# Patient Record
Sex: Female | Born: 1965
Health system: Southern US, Community
[De-identification: ages and names within clinical notes are randomized; demographics above are authoritative.]

## PROBLEM LIST (undated history)

## (undated) DIAGNOSIS — E039 Hypothyroidism, unspecified: Secondary | ICD-10-CM

## (undated) DIAGNOSIS — T7840XA Allergy, unspecified, initial encounter: Secondary | ICD-10-CM

## (undated) DIAGNOSIS — N840 Polyp of corpus uteri: Secondary | ICD-10-CM

## (undated) DIAGNOSIS — F502 Bulimia nervosa, unspecified: Secondary | ICD-10-CM

## (undated) DIAGNOSIS — E876 Hypokalemia: Secondary | ICD-10-CM

## (undated) DIAGNOSIS — E785 Hyperlipidemia, unspecified: Secondary | ICD-10-CM

## (undated) HISTORY — PX: BREAST BIOPSY: SHX20

## (undated) HISTORY — DX: Bulimia nervosa: F50.2

## (undated) HISTORY — DX: Allergy, unspecified, initial encounter: T78.40XA

## (undated) HISTORY — DX: Hypokalemia: E87.6

## (undated) HISTORY — DX: Bulimia nervosa, unspecified: F50.20

## (undated) HISTORY — PX: LIPOMA EXCISION: SHX5283

## (undated) HISTORY — DX: Hyperlipidemia, unspecified: E78.5

## (undated) HISTORY — DX: Polyp of corpus uteri: N84.0

## (undated) HISTORY — DX: Hypothyroidism, unspecified: E03.9

---

## 1992-11-11 HISTORY — PX: BUNIONECTOMY: SHX129

## 1992-11-11 HISTORY — PX: MANDIBLE RECONSTRUCTION: SHX431

## 1998-05-02 ENCOUNTER — Ambulatory Visit (HOSPITAL_COMMUNITY): Admission: RE | Admit: 1998-05-02 | Discharge: 1998-05-02 | Payer: Self-pay | Admitting: Obstetrics and Gynecology

## 1998-06-13 ENCOUNTER — Ambulatory Visit (HOSPITAL_COMMUNITY): Admission: RE | Admit: 1998-06-13 | Discharge: 1998-06-13 | Payer: Self-pay | Admitting: Obstetrics & Gynecology

## 1999-01-31 ENCOUNTER — Inpatient Hospital Stay (HOSPITAL_COMMUNITY): Admission: AD | Admit: 1999-01-31 | Discharge: 1999-02-03 | Payer: Self-pay | Admitting: *Deleted

## 1999-03-07 ENCOUNTER — Other Ambulatory Visit: Admission: RE | Admit: 1999-03-07 | Discharge: 1999-03-07 | Payer: Self-pay | Admitting: Obstetrics and Gynecology

## 2000-03-19 ENCOUNTER — Other Ambulatory Visit: Admission: RE | Admit: 2000-03-19 | Discharge: 2000-03-19 | Payer: Self-pay | Admitting: Obstetrics and Gynecology

## 2001-02-26 ENCOUNTER — Inpatient Hospital Stay (HOSPITAL_COMMUNITY): Admission: AD | Admit: 2001-02-26 | Discharge: 2001-02-26 | Payer: Self-pay | Admitting: Obstetrics and Gynecology

## 2001-04-04 ENCOUNTER — Encounter (INDEPENDENT_AMBULATORY_CARE_PROVIDER_SITE_OTHER): Payer: Self-pay | Admitting: Specialist

## 2001-04-04 ENCOUNTER — Inpatient Hospital Stay (HOSPITAL_COMMUNITY): Admission: AD | Admit: 2001-04-04 | Discharge: 2001-04-06 | Payer: Self-pay | Admitting: Obstetrics and Gynecology

## 2001-05-05 ENCOUNTER — Other Ambulatory Visit: Admission: RE | Admit: 2001-05-05 | Discharge: 2001-05-05 | Payer: Self-pay | Admitting: Obstetrics and Gynecology

## 2001-08-25 ENCOUNTER — Encounter: Payer: Self-pay | Admitting: Family Medicine

## 2001-08-25 ENCOUNTER — Encounter: Admission: RE | Admit: 2001-08-25 | Discharge: 2001-08-25 | Payer: Self-pay | Admitting: Family Medicine

## 2001-09-02 ENCOUNTER — Encounter: Admission: RE | Admit: 2001-09-02 | Discharge: 2001-09-02 | Payer: Self-pay | Admitting: Family Medicine

## 2001-09-02 ENCOUNTER — Encounter: Payer: Self-pay | Admitting: Family Medicine

## 2001-09-09 ENCOUNTER — Encounter: Admission: RE | Admit: 2001-09-09 | Discharge: 2001-09-09 | Payer: Self-pay | Admitting: Family Medicine

## 2001-09-09 ENCOUNTER — Encounter: Payer: Self-pay | Admitting: Family Medicine

## 2002-05-26 ENCOUNTER — Other Ambulatory Visit: Admission: RE | Admit: 2002-05-26 | Discharge: 2002-05-26 | Payer: Self-pay | Admitting: Obstetrics and Gynecology

## 2004-07-29 ENCOUNTER — Ambulatory Visit (HOSPITAL_COMMUNITY): Admission: RE | Admit: 2004-07-29 | Discharge: 2004-07-29 | Payer: Self-pay | Admitting: Sports Medicine

## 2005-04-15 ENCOUNTER — Other Ambulatory Visit: Admission: RE | Admit: 2005-04-15 | Discharge: 2005-04-15 | Payer: Self-pay | Admitting: Family Medicine

## 2005-04-22 ENCOUNTER — Ambulatory Visit (HOSPITAL_COMMUNITY): Admission: RE | Admit: 2005-04-22 | Discharge: 2005-04-22 | Payer: Self-pay | Admitting: Family Medicine

## 2005-04-24 ENCOUNTER — Encounter: Admission: RE | Admit: 2005-04-24 | Discharge: 2005-04-24 | Payer: Self-pay | Admitting: Family Medicine

## 2006-01-19 ENCOUNTER — Emergency Department (HOSPITAL_COMMUNITY): Admission: EM | Admit: 2006-01-19 | Discharge: 2006-01-19 | Payer: Self-pay | Admitting: Family Medicine

## 2006-12-24 ENCOUNTER — Encounter: Admission: RE | Admit: 2006-12-24 | Discharge: 2006-12-24 | Payer: Self-pay | Admitting: Family Medicine

## 2007-11-12 HISTORY — PX: ENDOMETRIAL ABLATION: SHX621

## 2007-12-28 ENCOUNTER — Encounter: Admission: RE | Admit: 2007-12-28 | Discharge: 2007-12-28 | Payer: Self-pay | Admitting: Family Medicine

## 2008-01-02 ENCOUNTER — Emergency Department (HOSPITAL_COMMUNITY): Admission: EM | Admit: 2008-01-02 | Discharge: 2008-01-02 | Payer: Self-pay | Admitting: Family Medicine

## 2008-04-06 ENCOUNTER — Ambulatory Visit (HOSPITAL_COMMUNITY): Admission: RE | Admit: 2008-04-06 | Discharge: 2008-04-06 | Payer: Self-pay | Admitting: Family Medicine

## 2008-06-10 ENCOUNTER — Encounter (INDEPENDENT_AMBULATORY_CARE_PROVIDER_SITE_OTHER): Payer: Self-pay | Admitting: Obstetrics and Gynecology

## 2008-06-10 ENCOUNTER — Ambulatory Visit (HOSPITAL_COMMUNITY): Admission: RE | Admit: 2008-06-10 | Discharge: 2008-06-10 | Payer: Self-pay | Admitting: Obstetrics and Gynecology

## 2008-12-18 ENCOUNTER — Ambulatory Visit (HOSPITAL_COMMUNITY): Admission: RE | Admit: 2008-12-18 | Discharge: 2008-12-18 | Payer: Self-pay | Admitting: Family Medicine

## 2009-02-20 ENCOUNTER — Encounter: Admission: RE | Admit: 2009-02-20 | Discharge: 2009-02-20 | Payer: Self-pay | Admitting: Family Medicine

## 2010-03-28 ENCOUNTER — Encounter: Admission: RE | Admit: 2010-03-28 | Discharge: 2010-03-28 | Payer: Self-pay | Admitting: Family Medicine

## 2011-01-20 ENCOUNTER — Inpatient Hospital Stay (INDEPENDENT_AMBULATORY_CARE_PROVIDER_SITE_OTHER)
Admission: RE | Admit: 2011-01-20 | Discharge: 2011-01-20 | Disposition: A | Discharge status: Home / Self Care | Payer: Commercial Managed Care - PPO | Source: Ambulatory Visit | Attending: Emergency Medicine | Admitting: Emergency Medicine

## 2011-01-20 DIAGNOSIS — J069 Acute upper respiratory infection, unspecified: Secondary | ICD-10-CM

## 2011-01-20 DIAGNOSIS — R05 Cough: Secondary | ICD-10-CM

## 2011-01-20 DIAGNOSIS — R071 Chest pain on breathing: Secondary | ICD-10-CM

## 2011-03-01 ENCOUNTER — Other Ambulatory Visit: Payer: Self-pay | Admitting: Family Medicine

## 2011-03-01 DIAGNOSIS — Z1231 Encounter for screening mammogram for malignant neoplasm of breast: Secondary | ICD-10-CM

## 2011-03-26 NOTE — Op Note (Signed)
Tamara Brewer, Tamara Brewer                 ACCOUNT NO.:  [id number]   MEDICAL RECORD NO.:  [id number]          PATIENT TYPE:  AMB   LOCATION:  SDC                           FACILITY:  [facility]   PHYSICIAN:  Dineen Kid. Rana Snare, M.D.    DATE OF BIRTH:  1966/03/19   DATE OF PROCEDURE:  06/10/2008  DATE OF DISCHARGE:                               OPERATIVE REPORT   PREOPERATIVE DIAGNOSES:  1. Abnormal uterine bleeding.  2. Endometrial mass on saline infusion ultrasound.  3. Menorrhagia.   POSTOPERATIVE DIAGNOSES:  1. Abnormal uterine bleeding.  2. Endometrial mass on saline infusion ultrasound.  3. Menorrhagia.  4. Endometrial polyp.   SURGEON:  Dineen Kid. Rana Snare, MD   PROCEDURES:  Hysteroscopy, dilation and curettage with polypectomy, and  NovaSure endometrial ablation.   INDICATIONS:  Ms. Wolfley is a 45 year old with abnormal uterine bleeding  and saline infusion ultrasound showing endometrial mass.  She has no  further childbearing desires and also wished to proceed with NovaSure  endometrial ablation.  The risk, benefits, and procedures were discussed  at length which include, but not limited to risk of infection, bleeding,  damage to the uterus, tubes, ovary, bowel, bladder, possibility of  recurrence of polyps, and possibility this may not alleviate the  bleeding.  Discussed the success rates and complication rates of the  NovaSure ablation technique.  She gives her informed consent and wishes  to proceed.   FINDINGS:  At the time of surgery, there is a large endometrial polyp,  otherwise normal-appearing endometrial cavity.   DESCRIPTION OF PROCEDURE:  After adequate analgesia, the patient was  placed in the dorsal lithotomy position.  She is sterilely prepped and  draped.  The bladder was sterilely drained.  Graves speculum was placed.  Tenaculum was placed on the anterior lip of the cervix.  Paracervical  block was placed with 1% Xylocaine and 1:100,000 epinephrine, a total of  20 mL used.   Uterus was sounded to 8 cm, easily dilated to a #27 Pratt  dilator.  Hysteroscope was inserted.  The above findings were noted.  Polyp forceps were used to grasp and remove the endometrial polyp  followed by sharp curettage.  Retrieving fragments of the polyp and also  curettings was performed to a gritty surface that was felt throughout  the endometrial cavity.  Reexamination of the hysteroscope revealed a  normal-appearing cavity.  No residual polyps noted.  The NovaSure device  was then inserted.  Cavity assessment test easily passed with cavity  width of 4.4 cm and a cavity length of 4.5 cm.  The device was activated  for 1 minute 10 seconds, then it was removed.  Reexamination with the  hysteroscope revealed good thermal cautery effect throughout the  endometrium.  No obvious complications were noted.  The hysteroscope was  then removed.  Tenaculum removed from the cervix, noted be hemostatic.  The patient was then turned over to Dr. Darnell Level for his outpatient  procedure.  The saline deficit was 100 mL.  The estimated blood loss was  minimal, and the patient received 1 g  of cefotetan preoperatively and 30  mg of Toradol postoperatively.   DISPOSITION:  The patient will be discharged to home and follow up in  the office in 2-3 weeks.  Sent home with a routine instruction sheet for  D&C.  Told to return for increased pain, fever, or bleeding.  Also sent  home with a prescription for oxycodone #20.       Dineen Kid Rana Snare, M.D.  Electronically Signed     DCL/MEDQ  D:  06/10/2008  T:  06/10/2008  Job:  [id number]

## 2011-03-26 NOTE — Op Note (Signed)
[name], [name]                 ACCOUNT NO.:  [id number]   MEDICAL RECORD NO.:  [id number]          PATIENT TYPE:  AMB   LOCATION:  SDC                           FACILITY:  [facility]   PHYSICIAN:  Velora Heckler, MD      DATE OF BIRTH:  April 14, 1966   DATE OF PROCEDURE:  06/10/2008  DATE OF DISCHARGE:                               OPERATIVE REPORT   PREOPERATIVE DIAGNOSIS:  Soft tissue mass, right axilla.   POSTOPERATIVE DIAGNOSIS:  Soft tissue mass, right axilla.   PROCEDURE:  Excision of soft tissue mass, right axilla (4 x 4 x 2 cm).   SURGEON:  Velora Heckler, MD, FACS   ANESTHESIA:  Local with intravenous sedation.   PREPARATION:  Betadine.   COMPLICATIONS:  None.   BLOOD LOSS:  Minimal.   INDICATIONS:  The patient is a 45 year old white female from [place],  [place].  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  She has a soft tissue mass in the lateral portion of the  right axilla, which has been present for approximately 5 years and  gradually increasing in size.  She desires excision.  This was not  concurrent with a gynecologic procedure performed at [facility] by  Dr. Rana Snare.   BODY OF REPORT:  Procedure is done at OR #4 at the [facility],  [place].  The patient had previously been brought to the operating  room by Dr. Rana Snare and had undergone gynecologic procedure.  The patient  was then repositioned and prepped and draped in the usual strict aseptic  fashion.   After ascertaining an adequate level of sedation have been achieved,  skin is anesthetized in the high lateral right axilla with local  anesthetic.  A 3-cm incision is made with #15 blade.  Dissection is  carried down to the subcutaneous tissues and hemostasis was obtained  with the electrocautery.  Soft tissue mass is localized.  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  It is excised using the electrocautery for  hemostasis.  The entire specimen measures  approximately 4 x 4 x 2 cm and  is submitted to pathology for review.  Good hemostasis is obtained  throughout the wound.  Subcutaneous tissues are closed with interrupted  3-0 Vicryl sutures.  Skin is closed with a  running 4-0 Monocryl subcuticular suture.  Wound is washed and dried,  and Benzoin and Steri-Strips are applied.  Sterile dressings are  applied.  The patient is awakened from anesthesia and brought to the  recovery room in stable condition.  The patient tolerated the procedure  well.      Velora Heckler, MD  Electronically Signed     TMG/MEDQ  D:  06/10/2008  T:  06/11/2008  Job:  [id number]   cc:   Talmadge Coventry, M.D.  Fax: [phone]   Dineen Kid. Rana Snare, M.D.  Fax: [phone]

## 2011-04-09 ENCOUNTER — Encounter: Payer: Self-pay | Admitting: Family Medicine

## 2011-04-09 ENCOUNTER — Ambulatory Visit (INDEPENDENT_AMBULATORY_CARE_PROVIDER_SITE_OTHER): Payer: 59 | Admitting: Family Medicine

## 2011-04-09 VITALS — BP 88/64 | HR 54 | Temp 97.9°F | Resp 14 | Ht 64.5 in | Wt 124.5 lb

## 2011-04-09 DIAGNOSIS — F502 Bulimia nervosa: Secondary | ICD-10-CM

## 2011-04-09 DIAGNOSIS — E039 Hypothyroidism, unspecified: Secondary | ICD-10-CM

## 2011-04-09 DIAGNOSIS — Z0000 Encounter for general adult medical examination without abnormal findings: Secondary | ICD-10-CM

## 2011-04-09 LAB — POCT URINALYSIS DIPSTICK
Bilirubin, UA: NEGATIVE
Blood, UA: NEGATIVE
Leukocytes, UA: NEGATIVE
Nitrite, UA: NEGATIVE
Protein, UA: NEGATIVE
Urobilinogen, UA: 0.2

## 2011-04-09 LAB — BASIC METABOLIC PANEL
BUN: 11 mg/dL (ref 6–23)
Calcium: 10 mg/dL (ref 8.4–10.5)
GFR: 63.95 mL/min (ref >60.00)
Potassium: 3.9 mEq/L (ref 3.5–5.1)
Sodium: 139 mEq/L (ref 135–145)

## 2011-04-09 LAB — CBC WITH DIFFERENTIAL/PLATELET
Basophils Relative: 0.6 % (ref 0.0–3.0)
Eosinophils Absolute: 0.1 10*3/uL (ref 0.0–0.7)
HCT: 41.2 % (ref 36.0–46.0)
Hemoglobin: 14.4 g/dL (ref 12.0–15.0)
Lymphocytes Relative: 24.9 % (ref 12.0–46.0)
Lymphs Abs: 1.6 10*3/uL (ref 0.7–4.0)
Monocytes Relative: 7.3 % (ref 3.0–12.0)
Neutro Abs: 4.1 10*3/uL (ref 1.4–7.7)
Neutrophils Relative %: 66 % (ref 43.0–77.0)
Platelets: 251 10*3/uL (ref 150.0–400.0)
WBC: 6.2 10*3/uL (ref 4.5–10.5)

## 2011-04-09 LAB — LIPID PANEL
LDL Cholesterol: 101 mg/dL — ABNORMAL HIGH (ref 0–99)
Total CHOL/HDL Ratio: 3
Triglycerides: 88 mg/dL (ref 0.0–149.0)

## 2011-04-09 LAB — HEPATIC FUNCTION PANEL
ALT: 20 U/L (ref 0–35)
Bilirubin, Direct: 0.1 mg/dL (ref 0.0–0.3)
Total Bilirubin: 0.9 mg/dL (ref 0.3–1.2)

## 2011-04-09 NOTE — Progress Notes (Signed)
  Subjective:    Patient ID: Tamara Brewer, female    DOB: 03/04/1966, 45 y.o.   MRN: [id number]  HPI 45 yr old female to establish with [place] after transfering from Dr. Noel Journey. She is also here for a cpx. Currently she is doing well with no complaints. She has ongoing issues with bulimia, but she says this is currently well controlled. She makes good dietary choices and exercises regularly. Her weight is stable, and she had complete labs about 2 months ago that were normal. She asks if she can stop her potassium pill since her level is now normal. This came up last year when she was purging a lot and causing herself to vomit. She no longer purges at all. Last year was difficult for her because of her husband's problems. She describes him as an "alcoholic", and after he lost his job last year he struggled with depression. He attempted suicide twice, and obviously this put her under a great deal of stress. Fortunately he is now working again, and his depression is under control. Consequently her stress levels are much better also. She sees Dr. Sandria Manly for GYN exams.    Review of Systems  Constitutional: Negative.   HENT: Negative.   Eyes: Negative.   Respiratory: Negative.   Cardiovascular: Negative.   Gastrointestinal: Negative.   Genitourinary: Negative for dysuria, urgency, frequency, hematuria, flank pain, decreased urine volume, enuresis, difficulty urinating, pelvic pain and dyspareunia.  Musculoskeletal: Negative.   Skin: Negative.   Neurological: Negative.   Hematological: Negative.   Psychiatric/Behavioral: Negative.        Objective:   Physical Exam  Constitutional: She is oriented to person, place, and time. She appears well-developed and well-nourished. No distress.  HENT:  Head: Normocephalic and atraumatic.  Right Ear: External ear normal.  Left Ear: External ear normal.  Nose: Nose normal.  Mouth/Throat: Oropharynx is clear and moist. No oropharyngeal exudate.  Eyes:  Conjunctivae and EOM are normal. Pupils are equal, round, and reactive to light. No scleral icterus.  Neck: Normal range of motion. Neck supple. No JVD present. No thyromegaly present.  Cardiovascular: Normal rate, regular rhythm, normal heart sounds and intact distal pulses.  Exam reveals no gallop and no friction rub.   No murmur heard. Pulmonary/Chest: Effort normal and breath sounds normal. No respiratory distress. She has no wheezes. She has no rales. She exhibits no tenderness.  Abdominal: Soft. Bowel sounds are normal. She exhibits no distension and no mass. There is no tenderness. There is no rebound and no guarding.  Musculoskeletal: Normal range of motion. She exhibits no edema and no tenderness.  Lymphadenopathy:    She has no cervical adenopathy.  Neurological: She is alert and oriented to person, place, and time. She has normal reflexes. No cranial nerve deficit. She exhibits normal muscle tone. Coordination normal.  Skin: Skin is warm and dry. No rash noted. No erythema.  Psychiatric: She has a normal mood and affect. Her behavior is normal. Judgment and thought content normal.          Assessment & Plan:  She seems to be doing very well, and her eating disorder seems to be under control. We will get fasting labs today, and if her potassium looks okay I will stop her supplement.

## 2011-04-10 ENCOUNTER — Encounter: Payer: Self-pay | Admitting: Family Medicine

## 2011-04-10 DIAGNOSIS — F502 Bulimia nervosa, unspecified: Secondary | ICD-10-CM | POA: Insufficient documentation

## 2011-04-10 DIAGNOSIS — E039 Hypothyroidism, unspecified: Secondary | ICD-10-CM | POA: Insufficient documentation

## 2011-04-12 ENCOUNTER — Ambulatory Visit
Admission: RE | Admit: 2011-04-12 | Discharge: 2011-04-12 | Disposition: A | Discharge status: Home / Self Care | Payer: 59 | Source: Ambulatory Visit | Attending: Family Medicine | Admitting: Family Medicine

## 2011-04-12 DIAGNOSIS — Z1231 Encounter for screening mammogram for malignant neoplasm of breast: Secondary | ICD-10-CM

## 2011-04-16 ENCOUNTER — Other Ambulatory Visit: Payer: Self-pay | Admitting: Family Medicine

## 2011-04-16 DIAGNOSIS — R928 Other abnormal and inconclusive findings on diagnostic imaging of breast: Secondary | ICD-10-CM

## 2011-04-26 ENCOUNTER — Ambulatory Visit
Admission: RE | Admit: 2011-04-26 | Discharge: 2011-04-26 | Disposition: A | Discharge status: Home / Self Care | Payer: 59 | Source: Ambulatory Visit | Attending: Family Medicine | Admitting: Family Medicine

## 2011-04-26 DIAGNOSIS — R928 Other abnormal and inconclusive findings on diagnostic imaging of breast: Secondary | ICD-10-CM

## 2011-05-03 ENCOUNTER — Other Ambulatory Visit: Payer: Self-pay | Admitting: Family Medicine

## 2011-05-03 MED ORDER — LEVOTHYROXINE SODIUM 75 MCG PO TABS: 75.0000 ug | ORAL_TABLET | Freq: Every day | ORAL | Status: DC

## 2011-05-03 NOTE — Telephone Encounter (Signed)
Pt needs new script written for levothyroxine (SYNTHROID, LEVOTHROID) 75 MCG tablet , sent to [facility] in [facility] at med ctr

## 2011-08-02 LAB — HERPES SIMPLEX VIRUS CULTURE: Culture: NOT DETECTED

## 2011-08-09 LAB — CBC
HCT: 41.5
MCV: 91.9
RDW: 12.5

## 2011-11-06 ENCOUNTER — Telehealth: Payer: Self-pay | Admitting: Family Medicine

## 2011-11-06 MED ORDER — LEVOTHYROXINE SODIUM 75 MCG PO TABS: 75.0000 ug | ORAL_TABLET | Freq: Every day | ORAL | Status: DC

## 2011-11-06 NOTE — Telephone Encounter (Signed)
Script sent e-scribe 

## 2012-01-06 ENCOUNTER — Encounter: Payer: Self-pay | Admitting: Family Medicine

## 2012-01-06 ENCOUNTER — Ambulatory Visit (INDEPENDENT_AMBULATORY_CARE_PROVIDER_SITE_OTHER): Payer: 59 | Admitting: Family Medicine

## 2012-01-06 VITALS — BP 108/60 | HR 75 | Temp 98.7°F | Wt 122.0 lb

## 2012-01-06 DIAGNOSIS — J329 Chronic sinusitis, unspecified: Secondary | ICD-10-CM

## 2012-01-06 MED ORDER — AZITHROMYCIN 250 MG PO TABS: ORAL_TABLET | ORAL | Status: AC

## 2012-01-06 NOTE — Progress Notes (Signed)
  Subjective:    Patient ID: Tamara Brewer, female    DOB: 12-29-1965, 46 y.o.   MRN: [id number]  HPI Here for one week of stuffy head, PND, ST, and a dry cough. No fever. On Mucinex.    Review of Systems  Constitutional: Negative.   HENT: Positive for congestion, postnasal drip and sinus pressure.   Eyes: Negative.   Respiratory: Positive for cough.        Objective:   Physical Exam  Constitutional: She appears well-developed and well-nourished.  HENT:  Right Ear: External ear normal.  Left Ear: External ear normal.  Nose: Nose normal.  Mouth/Throat: Oropharynx is clear and moist. No oropharyngeal exudate.  Eyes: Conjunctivae are normal.  Neck: No thyromegaly present.  Pulmonary/Chest: Effort normal and breath sounds normal.  Lymphadenopathy:    She has no cervical adenopathy.          Assessment & Plan:  Recheck prn

## 2012-03-27 ENCOUNTER — Other Ambulatory Visit (INDEPENDENT_AMBULATORY_CARE_PROVIDER_SITE_OTHER): Payer: 59

## 2012-03-27 DIAGNOSIS — Z0000 Encounter for general adult medical examination without abnormal findings: Secondary | ICD-10-CM

## 2012-03-27 LAB — POCT URINALYSIS DIPSTICK
Nitrite, UA: NEGATIVE
Urobilinogen, UA: 0.2
pH, UA: 8.5

## 2012-03-27 LAB — HEPATIC FUNCTION PANEL
Bilirubin, Direct: 0.1 mg/dL (ref 0.0–0.3)
Total Bilirubin: 1 mg/dL (ref 0.3–1.2)

## 2012-03-27 LAB — CBC WITH DIFFERENTIAL/PLATELET
Basophils Absolute: 0.1 10*3/uL (ref 0.0–0.1)
Basophils Relative: 1.2 % (ref 0.0–3.0)
Eosinophils Absolute: 0.1 10*3/uL (ref 0.0–0.7)
Lymphocytes Relative: 29.4 % (ref 12.0–46.0)
Monocytes Absolute: 0.5 10*3/uL (ref 0.1–1.0)
Neutrophils Relative %: 59 % (ref 43.0–77.0)
Platelets: 252 10*3/uL (ref 150.0–400.0)
WBC: 5.9 10*3/uL (ref 4.5–10.5)

## 2012-03-27 LAB — BASIC METABOLIC PANEL
BUN: 16 mg/dL (ref 6–23)
CO2: 29 mEq/L (ref 19–32)
Calcium: 9.6 mg/dL (ref 8.4–10.5)
Creatinine, Ser: 0.9 mg/dL (ref 0.4–1.2)
GFR: 68.39 mL/min (ref >60.00)
Glucose, Bld: 81 mg/dL (ref 70–99)
Potassium: 2.7 mEq/L — CL (ref 3.5–5.1)

## 2012-03-27 LAB — TSH: TSH: 4.08 u[IU]/mL (ref 0.35–5.50)

## 2012-03-27 LAB — LIPID PANEL: Triglycerides: 89 mg/dL (ref 0.0–149.0)

## 2012-03-31 ENCOUNTER — Encounter: Payer: Self-pay | Admitting: Family Medicine

## 2012-03-31 MED ORDER — POTASSIUM CHLORIDE 20 MEQ PO PACK: 20.0000 meq | PACK | Freq: Every day | ORAL | Status: DC

## 2012-03-31 NOTE — Progress Notes (Signed)
Quick Note:  I left voice message for pt to return my call. ______ 

## 2012-03-31 NOTE — Progress Notes (Signed)
Addended by: Aniceto Boss A on: 03/31/2012 03:16 PM   Modules accepted: Orders

## 2012-03-31 NOTE — Progress Notes (Signed)
Quick Note:  I spoke with pt and sent script e-scribe. ______ 

## 2012-04-13 ENCOUNTER — Other Ambulatory Visit: Payer: Self-pay | Admitting: Family Medicine

## 2012-04-13 DIAGNOSIS — Z1231 Encounter for screening mammogram for malignant neoplasm of breast: Secondary | ICD-10-CM

## 2012-04-24 ENCOUNTER — Encounter: Payer: Self-pay | Admitting: Family Medicine

## 2012-04-24 ENCOUNTER — Other Ambulatory Visit (HOSPITAL_COMMUNITY)
Admission: RE | Admit: 2012-04-24 | Discharge: 2012-04-24 | Disposition: A | Discharge status: Home / Self Care | Payer: 59 | Source: Ambulatory Visit | Attending: Family Medicine | Admitting: Family Medicine

## 2012-04-24 ENCOUNTER — Ambulatory Visit (INDEPENDENT_AMBULATORY_CARE_PROVIDER_SITE_OTHER): Payer: 59 | Admitting: Family Medicine

## 2012-04-24 VITALS — BP 110/72 | HR 71 | Temp 98.5°F | Ht 63.5 in | Wt 121.0 lb

## 2012-04-24 DIAGNOSIS — Z0000 Encounter for general adult medical examination without abnormal findings: Secondary | ICD-10-CM

## 2012-04-24 DIAGNOSIS — Z01419 Encounter for gynecological examination (general) (routine) without abnormal findings: Secondary | ICD-10-CM | POA: Insufficient documentation

## 2012-04-24 MED ORDER — LEVOTHYROXINE SODIUM 75 MCG PO TABS: 75.0000 ug | ORAL_TABLET | Freq: Every day | ORAL | Status: DC

## 2012-04-24 NOTE — Progress Notes (Signed)
Subjective:    Patient ID: Tamara Brewer, female    DOB: 09-28-1966, 46 y.o.   MRN: [id number]  HPI 46 yr old female for a cpx. She feels fine and has no concerns. 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 She has a mammogram set up for next week.   Review of Systems  Constitutional: Negative.  Negative for fever, diaphoresis, activity change, appetite change, fatigue and unexpected weight change.  HENT: Negative.  Negative for hearing loss, ear pain, nosebleeds, congestion, sore throat, trouble swallowing, neck pain, neck stiffness, voice change and tinnitus.   Eyes: Negative.  Negative for photophobia, pain, discharge, redness and visual disturbance.  Respiratory: Negative.  Negative for apnea, cough, choking, chest tightness, shortness of breath, wheezing and stridor.   Cardiovascular: Negative.  Negative for chest pain, palpitations and leg swelling.  Gastrointestinal: Negative.  Negative for nausea, vomiting, abdominal pain, diarrhea, constipation, blood in stool, abdominal distention and rectal pain.  Genitourinary: Negative.  Negative for dysuria, urgency, frequency, hematuria, flank pain, vaginal bleeding, vaginal discharge, enuresis, difficulty urinating, vaginal pain and menstrual problem.  Musculoskeletal: Negative.  Negative for myalgias, back pain, joint swelling, arthralgias and gait problem.  Skin: Negative.  Negative for color change, pallor, rash and wound.  Neurological: Negative.  Negative for dizziness, tremors, seizures, syncope, speech difficulty, weakness, light-headedness, numbness and headaches.  Hematological: Negative.  Negative for adenopathy. Does not bruise/bleed easily.  Psychiatric/Behavioral: Negative.  Negative for hallucinations, behavioral problems, confusion, disturbed wake/sleep cycle, dysphoric mood and agitation. The patient is not nervous/anxious.        Objective:   Physical Exam  Constitutional: She appears well-developed and well-nourished. No  distress.  HENT:  Head: Normocephalic and atraumatic.  Right Ear: External ear normal.  Left Ear: External ear normal.  Nose: Nose normal.  Mouth/Throat: Oropharynx is clear and moist. No oropharyngeal exudate.  Eyes: Conjunctivae and EOM are normal. Pupils are equal, round, and reactive to light. Right eye exhibits no discharge. Left eye exhibits no discharge. No scleral icterus.  Neck: Normal range of motion. Neck supple. No JVD present. No thyromegaly present.  Cardiovascular: Normal rate, regular rhythm, normal heart sounds and intact distal pulses.  Exam reveals no gallop and no friction rub.   No murmur heard. Pulmonary/Chest: Effort normal and breath sounds normal. No stridor. No respiratory distress. She has no wheezes. She has no rales. She exhibits no tenderness.  Abdominal: Soft. Normal appearance and bowel sounds are normal. She exhibits no distension, no abdominal bruit, no ascites and no mass. There is no hepatosplenomegaly. There is no tenderness. There is no rigidity, no rebound and no guarding. No hernia.  Genitourinary: Rectum normal, vagina normal and uterus normal. No breast swelling, tenderness, discharge or bleeding. Cervix exhibits no motion tenderness, no discharge and no friability. Right adnexum displays no mass, no tenderness and no fullness. Left adnexum displays no mass, no tenderness and no fullness. No erythema, tenderness or bleeding around the vagina. No vaginal discharge found.  Musculoskeletal: Normal range of motion. She exhibits no edema and no tenderness.  Lymphadenopathy:    She has no cervical adenopathy.  Neurological: She is alert. She has normal reflexes. No cranial nerve deficit. She exhibits normal muscle tone. Coordination normal.  Skin: Skin is warm and dry. No rash noted. She is not diaphoretic. No erythema. No pallor.  Psychiatric: She has a normal mood and affect. Her behavior is normal. Judgment and thought content normal.  Assessment & Plan:  Well exam.

## 2012-04-24 NOTE — Addendum Note (Signed)
Addended by: Aniceto Boss A on: 04/24/2012 10:01 AM   Modules accepted: Orders

## 2012-04-28 ENCOUNTER — Ambulatory Visit
Admission: RE | Admit: 2012-04-28 | Discharge: 2012-04-28 | Disposition: A | Discharge status: Home / Self Care | Payer: 59 | Source: Ambulatory Visit | Attending: Family Medicine | Admitting: Family Medicine

## 2012-04-28 DIAGNOSIS — Z1231 Encounter for screening mammogram for malignant neoplasm of breast: Secondary | ICD-10-CM

## 2012-05-01 ENCOUNTER — Other Ambulatory Visit: Payer: Self-pay | Admitting: *Deleted

## 2012-05-01 ENCOUNTER — Other Ambulatory Visit (INDEPENDENT_AMBULATORY_CARE_PROVIDER_SITE_OTHER): Payer: 59

## 2012-05-01 DIAGNOSIS — E876 Hypokalemia: Secondary | ICD-10-CM

## 2012-05-01 LAB — BASIC METABOLIC PANEL
BUN: 13 mg/dL (ref 6–23)
CO2: 29 mEq/L (ref 19–32)
Calcium: 9 mg/dL (ref 8.4–10.5)
Creatinine, Ser: 0.9 mg/dL (ref 0.4–1.2)
GFR: 76.78 mL/min (ref >60.00)
Glucose, Bld: 139 mg/dL — ABNORMAL HIGH (ref 70–99)
Sodium: 138 mEq/L (ref 135–145)

## 2012-05-04 MED ORDER — POTASSIUM CHLORIDE CRYS ER 20 MEQ PO TBCR: 20.0000 meq | EXTENDED_RELEASE_TABLET | Freq: Two times a day (BID) | ORAL | Status: DC

## 2012-05-04 NOTE — Progress Notes (Signed)
Quick Note:  I spoke with pt and I sent in script e-scribe. ______

## 2013-03-12 ENCOUNTER — Ambulatory Visit: Payer: 59 | Admitting: Family Medicine

## 2013-03-23 ENCOUNTER — Other Ambulatory Visit: Payer: Self-pay

## 2013-03-23 DIAGNOSIS — Z1231 Encounter for screening mammogram for malignant neoplasm of breast: Secondary | ICD-10-CM

## 2013-04-19 ENCOUNTER — Other Ambulatory Visit (INDEPENDENT_AMBULATORY_CARE_PROVIDER_SITE_OTHER): Payer: 59

## 2013-04-19 DIAGNOSIS — Z0000 Encounter for general adult medical examination without abnormal findings: Secondary | ICD-10-CM

## 2013-04-19 LAB — POCT URINALYSIS DIPSTICK
Glucose, UA: NEGATIVE
Leukocytes, UA: NEGATIVE
Nitrite, UA: NEGATIVE
pH, UA: >=9

## 2013-04-19 LAB — CBC WITH DIFFERENTIAL/PLATELET
Basophils Absolute: 0 10*3/uL (ref 0.0–0.1)
Eosinophils Relative: 2.7 % (ref 0.0–5.0)
HCT: 41.3 % (ref 36.0–46.0)
Hemoglobin: 14.2 g/dL (ref 12.0–15.0)
Monocytes Absolute: 0.5 10*3/uL (ref 0.1–1.0)
Monocytes Relative: 9 % (ref 3.0–12.0)
Neutrophils Relative %: 56.6 % (ref 43.0–77.0)
Platelets: 254 10*3/uL (ref 150.0–400.0)
RBC: 4.49 Mil/uL (ref 3.87–5.11)
RDW: 13.1 % (ref 11.5–14.6)

## 2013-04-19 LAB — BASIC METABOLIC PANEL
CO2: 35 mEq/L — ABNORMAL HIGH (ref 19–32)
Chloride: 98 mEq/L (ref 96–112)
Glucose, Bld: 88 mg/dL (ref 70–99)
Potassium: 3.6 mEq/L (ref 3.5–5.1)
Sodium: 139 mEq/L (ref 135–145)

## 2013-04-19 LAB — LIPID PANEL
HDL: 68 mg/dL (ref >39.00)
Triglycerides: 115 mg/dL (ref 0.0–149.0)
VLDL: 23 mg/dL (ref 0.0–40.0)

## 2013-04-19 LAB — HEPATIC FUNCTION PANEL
ALT: 23 U/L (ref 0–35)
AST: 29 U/L (ref 0–37)
Bilirubin, Direct: 0.1 mg/dL (ref 0.0–0.3)
Total Bilirubin: 1 mg/dL (ref 0.3–1.2)

## 2013-04-20 NOTE — Progress Notes (Signed)
Quick Note:  Pt has appointment on 04/28/13 will go over then. ______ 

## 2013-04-28 ENCOUNTER — Other Ambulatory Visit: Payer: 59

## 2013-04-28 ENCOUNTER — Ambulatory Visit (INDEPENDENT_AMBULATORY_CARE_PROVIDER_SITE_OTHER): Payer: 59 | Admitting: Family Medicine

## 2013-04-28 ENCOUNTER — Encounter: Payer: Self-pay | Admitting: Family Medicine

## 2013-04-28 VITALS — BP 100/64 | HR 59 | Temp 97.8°F | Ht 64.0 in | Wt 126.0 lb

## 2013-04-28 DIAGNOSIS — Z0000 Encounter for general adult medical examination without abnormal findings: Secondary | ICD-10-CM

## 2013-04-28 MED ORDER — POTASSIUM CHLORIDE CRYS ER 20 MEQ PO TBCR: 20.0000 meq | EXTENDED_RELEASE_TABLET | Freq: Every day | ORAL | Status: DC

## 2013-04-28 MED ORDER — LEVOTHYROXINE SODIUM 75 MCG PO TABS: 75.0000 ug | ORAL_TABLET | Freq: Every day | ORAL | Status: DC

## 2013-04-28 NOTE — Progress Notes (Signed)
  Subjective:    Patient ID: Tamara Brewer, female    DOB: Sep 17, 1966, 47 y.o.   MRN: [id number]  HPI 47 yr old female for a cpx. She feels well and has no concerns. She eats well and exercises regularly.    Review of Systems  Constitutional: Negative.   HENT: Negative.   Eyes: Negative.   Respiratory: Negative.   Cardiovascular: Negative.   Gastrointestinal: Negative.   Genitourinary: Negative for dysuria, urgency, frequency, hematuria, flank pain, decreased urine volume, enuresis, difficulty urinating, pelvic pain and dyspareunia.  Musculoskeletal: Negative.   Skin: Negative.   Neurological: Negative.   Psychiatric/Behavioral: Negative.        Objective:   Physical Exam  Constitutional: She is oriented to person, place, and time. She appears well-developed and well-nourished. No distress.  HENT:  Head: Normocephalic and atraumatic.  Right Ear: External ear normal.  Left Ear: External ear normal.  Nose: Nose normal.  Mouth/Throat: Oropharynx is clear and moist. No oropharyngeal exudate.  Eyes: Conjunctivae and EOM are normal. Pupils are equal, round, and reactive to light. No scleral icterus.  Neck: Normal range of motion. Neck supple. No JVD present. No thyromegaly present.  Cardiovascular: Normal rate, regular rhythm, normal heart sounds and intact distal pulses.  Exam reveals no gallop and no friction rub.   No murmur heard. Pulmonary/Chest: Effort normal and breath sounds normal. No respiratory distress. She has no wheezes. She has no rales. She exhibits no tenderness.  Abdominal: Soft. Bowel sounds are normal. She exhibits no distension and no mass. There is no tenderness. There is no rebound and no guarding.  Musculoskeletal: Normal range of motion. She exhibits no edema and no tenderness.  Lymphadenopathy:    She has no cervical adenopathy.  Neurological: She is alert and oriented to person, place, and time. She has normal reflexes. No cranial nerve deficit. She  exhibits normal muscle tone. Coordination normal.  Skin: Skin is warm and dry. No rash noted. No erythema.  Psychiatric: She has a normal mood and affect. Her behavior is normal. Judgment and thought content normal.          Assessment & Plan:  Well exam.

## 2013-05-04 ENCOUNTER — Ambulatory Visit
Admission: RE | Admit: 2013-05-04 | Discharge: 2013-05-04 | Disposition: A | Discharge status: Home / Self Care | Payer: 59 | Source: Ambulatory Visit

## 2013-05-04 DIAGNOSIS — Z1231 Encounter for screening mammogram for malignant neoplasm of breast: Secondary | ICD-10-CM

## 2013-05-05 ENCOUNTER — Encounter: Payer: 59 | Admitting: Family Medicine

## 2013-09-16 ENCOUNTER — Other Ambulatory Visit: Payer: Self-pay

## 2014-03-31 ENCOUNTER — Other Ambulatory Visit: Payer: Self-pay

## 2014-03-31 DIAGNOSIS — Z1231 Encounter for screening mammogram for malignant neoplasm of breast: Secondary | ICD-10-CM

## 2014-04-18 ENCOUNTER — Other Ambulatory Visit (INDEPENDENT_AMBULATORY_CARE_PROVIDER_SITE_OTHER): Payer: 59

## 2014-04-18 DIAGNOSIS — Z0000 Encounter for general adult medical examination without abnormal findings: Secondary | ICD-10-CM

## 2014-04-18 LAB — POCT URINALYSIS DIPSTICK
Bilirubin, UA: NEGATIVE
Blood, UA: NEGATIVE
Glucose, UA: NEGATIVE
KETONES UA: NEGATIVE
Nitrite, UA: NEGATIVE
Protein, UA: NEGATIVE
Spec Grav, UA: 1.015
UROBILINOGEN UA: 0.2
pH, UA: 8

## 2014-04-18 LAB — CBC WITH DIFFERENTIAL/PLATELET
Basophils Absolute: 0 10*3/uL (ref 0.0–0.1)
Basophils Relative: 0.8 % (ref 0.0–3.0)
EOS ABS: 0.2 10*3/uL (ref 0.0–0.7)
Eosinophils Relative: 3.2 % (ref 0.0–5.0)
HCT: 41.1 % (ref 36.0–46.0)
Hemoglobin: 14 g/dL (ref 12.0–15.0)
LYMPHS PCT: 35.8 % (ref 12.0–46.0)
Lymphs Abs: 1.8 10*3/uL (ref 0.7–4.0)
MCHC: 34.1 g/dL (ref 30.0–36.0)
MCV: 92 fl (ref 78.0–100.0)
Monocytes Absolute: 0.4 10*3/uL (ref 0.1–1.0)
Monocytes Relative: 8.5 % (ref 3.0–12.0)
NEUTROS PCT: 51.7 % (ref 43.0–77.0)
Neutro Abs: 2.6 10*3/uL (ref 1.4–7.7)
Platelets: 259 10*3/uL (ref 150.0–400.0)
RBC: 4.47 Mil/uL (ref 3.87–5.11)
RDW: 13 % (ref 11.5–15.5)
WBC: 5 10*3/uL (ref 4.0–10.5)

## 2014-04-18 LAB — BASIC METABOLIC PANEL
BUN: 16 mg/dL (ref 6–23)
CALCIUM: 10.2 mg/dL (ref 8.4–10.5)
CO2: 32 meq/L (ref 19–32)
CREATININE: 0.8 mg/dL (ref 0.4–1.2)
Chloride: 98 mEq/L (ref 96–112)
GFR: 78.24 mL/min (ref >60.00)
GLUCOSE: 86 mg/dL (ref 70–99)
Potassium: 3.6 mEq/L (ref 3.5–5.1)
SODIUM: 139 meq/L (ref 135–145)

## 2014-04-18 LAB — LIPID PANEL
CHOLESTEROL: 226 mg/dL — AB (ref 0–200)
HDL: 73.1 mg/dL (ref >39.00)
LDL CALC: 125 mg/dL — AB (ref 0–99)
NonHDL: 152.9
TRIGLYCERIDES: 140 mg/dL (ref 0.0–149.0)
Total CHOL/HDL Ratio: 3
VLDL: 28 mg/dL (ref 0.0–40.0)

## 2014-04-18 LAB — TSH: TSH: 5.25 u[IU]/mL — ABNORMAL HIGH (ref 0.35–4.50)

## 2014-04-18 LAB — HEPATIC FUNCTION PANEL
ALBUMIN: 4.5 g/dL (ref 3.5–5.2)
ALK PHOS: 52 U/L (ref 39–117)
ALT: 22 U/L (ref 0–35)
AST: 26 U/L (ref 0–37)
BILIRUBIN DIRECT: 0.1 mg/dL (ref 0.0–0.3)
BILIRUBIN TOTAL: 0.9 mg/dL (ref 0.2–1.2)
Total Protein: 7.2 g/dL (ref 6.0–8.3)

## 2014-04-21 MED ORDER — LEVOTHYROXINE SODIUM 100 MCG PO TABS: 100.0000 ug | ORAL_TABLET | Freq: Every day | ORAL | Status: DC

## 2014-04-21 NOTE — Addendum Note (Signed)
Addended by: Aggie Hacker A on: 04/21/2014 11:54 AM   Modules accepted: Orders, Medications

## 2014-04-25 ENCOUNTER — Encounter: Payer: 59 | Admitting: Family Medicine

## 2014-05-10 ENCOUNTER — Ambulatory Visit
Admission: RE | Admit: 2014-05-10 | Discharge: 2014-05-10 | Disposition: A | Discharge status: Home / Self Care | Payer: 59 | Source: Ambulatory Visit

## 2014-05-10 ENCOUNTER — Encounter: Payer: 59 | Admitting: Family Medicine

## 2014-05-10 DIAGNOSIS — Z1231 Encounter for screening mammogram for malignant neoplasm of breast: Secondary | ICD-10-CM

## 2014-06-06 ENCOUNTER — Other Ambulatory Visit: Payer: Self-pay | Admitting: Family Medicine

## 2014-06-13 ENCOUNTER — Ambulatory Visit (INDEPENDENT_AMBULATORY_CARE_PROVIDER_SITE_OTHER): Payer: 59 | Admitting: Family Medicine

## 2014-06-13 ENCOUNTER — Encounter: Payer: Self-pay | Admitting: Family Medicine

## 2014-06-13 VITALS — BP 101/62 | HR 57 | Temp 97.7°F | Ht 64.0 in | Wt 124.0 lb

## 2014-06-13 DIAGNOSIS — E876 Hypokalemia: Secondary | ICD-10-CM

## 2014-06-13 DIAGNOSIS — Z0000 Encounter for general adult medical examination without abnormal findings: Secondary | ICD-10-CM

## 2014-06-13 MED ORDER — POTASSIUM CHLORIDE CRYS ER 20 MEQ PO TBCR: EXTENDED_RELEASE_TABLET | ORAL | Status: DC

## 2014-06-13 NOTE — Progress Notes (Signed)
Pre visit review using our clinic review tool, if applicable. No additional management support is needed unless otherwise documented below in the visit note. 

## 2014-06-13 NOTE — Progress Notes (Signed)
   Subjective:    Patient ID: Tamara Brewer, female    DOB: 02/24/1966, 48 y.o.   MRN: [id number]  HPI 47 yr old female for a cpx. She feels well except for some mild fatigue lately. We discovered her Synthroid dose needed to be increased, so hopefully this will help.    Review of Systems  Constitutional: Negative.   HENT: Negative.   Eyes: Negative.   Respiratory: Negative.   Cardiovascular: Negative.   Gastrointestinal: Negative.   Genitourinary: Negative for dysuria, urgency, frequency, hematuria, flank pain, decreased urine volume, enuresis, difficulty urinating, pelvic pain and dyspareunia.  Musculoskeletal: Negative.   Skin: Negative.   Neurological: Negative.   Psychiatric/Behavioral: Negative.        Objective:   Physical Exam  Constitutional: She is oriented to person, place, and time. She appears well-developed and well-nourished. No distress.  HENT:  Head: Normocephalic and atraumatic.  Right Ear: External ear normal.  Left Ear: External ear normal.  Nose: Nose normal.  Mouth/Throat: Oropharynx is clear and moist. No oropharyngeal exudate.  Eyes: Conjunctivae and EOM are normal. Pupils are equal, round, and reactive to light. No scleral icterus.  Neck: Normal range of motion. Neck supple. No JVD present. No thyromegaly present.  Cardiovascular: Normal rate, regular rhythm, normal heart sounds and intact distal pulses.  Exam reveals no gallop and no friction rub.   No murmur heard. Pulmonary/Chest: Effort normal and breath sounds normal. No respiratory distress. She has no wheezes. She has no rales. She exhibits no tenderness.  Abdominal: Soft. Bowel sounds are normal. She exhibits no distension and no mass. There is no tenderness. There is no rebound and no guarding.  Musculoskeletal: Normal range of motion. She exhibits no edema and no tenderness.  Lymphadenopathy:    She has no cervical adenopathy.  Neurological: She is alert and oriented to person, place, and  time. She has normal reflexes. No cranial nerve deficit. She exhibits normal muscle tone. Coordination normal.  Skin: Skin is warm and dry. No rash noted. No erythema.  Psychiatric: She has a normal mood and affect. Her behavior is normal. Judgment and thought content normal.          Assessment & Plan:  Well exam.

## 2014-07-22 ENCOUNTER — Other Ambulatory Visit (INDEPENDENT_AMBULATORY_CARE_PROVIDER_SITE_OTHER): Payer: 59

## 2014-07-22 DIAGNOSIS — E039 Hypothyroidism, unspecified: Secondary | ICD-10-CM

## 2014-07-22 LAB — TSH: TSH: 0.17 u[IU]/mL — ABNORMAL LOW (ref 0.35–4.50)

## 2014-07-27 ENCOUNTER — Other Ambulatory Visit: Payer: Self-pay | Admitting: Family Medicine

## 2014-08-02 ENCOUNTER — Other Ambulatory Visit: Payer: Self-pay | Admitting: Obstetrics and Gynecology

## 2014-08-03 LAB — CYTOLOGY - PAP

## 2014-08-05 ENCOUNTER — Other Ambulatory Visit: Payer: Self-pay | Admitting: Obstetrics and Gynecology

## 2014-08-05 DIAGNOSIS — Z9189 Other specified personal risk factors, not elsewhere classified: Secondary | ICD-10-CM

## 2014-08-05 DIAGNOSIS — Z803 Family history of malignant neoplasm of breast: Secondary | ICD-10-CM

## 2014-09-02 ENCOUNTER — Other Ambulatory Visit: Payer: 59

## 2014-10-14 ENCOUNTER — Ambulatory Visit
Admission: RE | Admit: 2014-10-14 | Discharge: 2014-10-14 | Disposition: A | Discharge status: Home / Self Care | Payer: 59 | Source: Ambulatory Visit | Attending: Obstetrics and Gynecology | Admitting: Obstetrics and Gynecology

## 2014-10-14 DIAGNOSIS — Z803 Family history of malignant neoplasm of breast: Secondary | ICD-10-CM

## 2014-10-14 DIAGNOSIS — Z9189 Other specified personal risk factors, not elsewhere classified: Secondary | ICD-10-CM

## 2014-10-14 MED ORDER — GADOBENATE DIMEGLUMINE 529 MG/ML IV SOLN
11.0000 mL | Freq: Once | INTRAVENOUS | Status: AC | PRN
  Administered 2014-10-14: 11 mL via INTRAVENOUS

## 2014-10-18 ENCOUNTER — Other Ambulatory Visit: Payer: Self-pay | Admitting: Obstetrics and Gynecology

## 2014-10-18 DIAGNOSIS — R928 Other abnormal and inconclusive findings on diagnostic imaging of breast: Secondary | ICD-10-CM

## 2014-10-21 ENCOUNTER — Other Ambulatory Visit: Payer: Self-pay | Admitting: Obstetrics and Gynecology

## 2014-10-21 ENCOUNTER — Ambulatory Visit
Admission: RE | Admit: 2014-10-21 | Discharge: 2014-10-21 | Disposition: A | Discharge status: Home / Self Care | Payer: 59 | Source: Ambulatory Visit | Attending: Obstetrics and Gynecology | Admitting: Obstetrics and Gynecology

## 2014-10-21 DIAGNOSIS — R928 Other abnormal and inconclusive findings on diagnostic imaging of breast: Secondary | ICD-10-CM

## 2014-10-27 ENCOUNTER — Ambulatory Visit
Admission: RE | Admit: 2014-10-27 | Discharge: 2014-10-27 | Disposition: A | Discharge status: Home / Self Care | Payer: 59 | Source: Ambulatory Visit | Attending: Obstetrics and Gynecology | Admitting: Obstetrics and Gynecology

## 2014-10-27 DIAGNOSIS — R928 Other abnormal and inconclusive findings on diagnostic imaging of breast: Secondary | ICD-10-CM

## 2014-10-27 MED ORDER — GADOBENATE DIMEGLUMINE 529 MG/ML IV SOLN
11.0000 mL | Freq: Once | INTRAVENOUS | Status: AC | PRN
  Administered 2014-10-27: 11 mL via INTRAVENOUS

## 2015-01-02 ENCOUNTER — Emergency Department (HOSPITAL_COMMUNITY)
Admission: EM | Admit: 2015-01-02 | Discharge: 2015-01-03 | Disposition: A | Discharge status: Home / Self Care | Payer: 59 | Attending: Emergency Medicine | Admitting: Emergency Medicine

## 2015-01-02 ENCOUNTER — Encounter (HOSPITAL_COMMUNITY): Payer: Self-pay | Admitting: *Deleted

## 2015-01-02 DIAGNOSIS — R1031 Right lower quadrant pain: Secondary | ICD-10-CM | POA: Diagnosis not present

## 2015-01-02 DIAGNOSIS — R109 Unspecified abdominal pain: Secondary | ICD-10-CM | POA: Diagnosis present

## 2015-01-02 DIAGNOSIS — Z79899 Other long term (current) drug therapy: Secondary | ICD-10-CM | POA: Insufficient documentation

## 2015-01-02 DIAGNOSIS — Z8742 Personal history of other diseases of the female genital tract: Secondary | ICD-10-CM | POA: Diagnosis not present

## 2015-01-02 DIAGNOSIS — E039 Hypothyroidism, unspecified: Secondary | ICD-10-CM | POA: Diagnosis not present

## 2015-01-02 DIAGNOSIS — R110 Nausea: Secondary | ICD-10-CM | POA: Insufficient documentation

## 2015-01-02 DIAGNOSIS — E876 Hypokalemia: Secondary | ICD-10-CM | POA: Diagnosis not present

## 2015-01-02 DIAGNOSIS — Z8659 Personal history of other mental and behavioral disorders: Secondary | ICD-10-CM | POA: Insufficient documentation

## 2015-01-02 LAB — CBC WITH DIFFERENTIAL/PLATELET
BASOS ABS: 0 10*3/uL (ref 0.0–0.1)
Basophils Relative: 0 % (ref 0–1)
Eosinophils Absolute: 0.1 10*3/uL (ref 0.0–0.7)
Eosinophils Relative: 2 % (ref 0–5)
HEMATOCRIT: 41.9 % (ref 36.0–46.0)
HEMOGLOBIN: 14.6 g/dL (ref 12.0–15.0)
LYMPHS ABS: 2.2 10*3/uL (ref 0.7–4.0)
LYMPHS PCT: 29 % (ref 12–46)
MCH: 30.4 pg (ref 26.0–34.0)
MCHC: 34.8 g/dL (ref 30.0–36.0)
MCV: 87.3 fL (ref 78.0–100.0)
MONOS PCT: 4 % (ref 3–12)
Monocytes Absolute: 0.3 10*3/uL (ref 0.1–1.0)
NEUTROS ABS: 5 10*3/uL (ref 1.7–7.7)
Neutrophils Relative %: 65 % (ref 43–77)
PLATELETS: 283 10*3/uL (ref 150–400)
RBC: 4.8 MIL/uL (ref 3.87–5.11)
RDW: 12.3 % (ref 11.5–15.5)
WBC: 7.6 10*3/uL (ref 4.0–10.5)

## 2015-01-02 LAB — COMPREHENSIVE METABOLIC PANEL
ALT: 21 U/L (ref 0–35)
AST: 26 U/L (ref 0–37)
Albumin: 4.8 g/dL (ref 3.5–5.2)
Alkaline Phosphatase: 66 U/L (ref 39–117)
Anion gap: 11 (ref 5–15)
BUN: 15 mg/dL (ref 6–23)
CO2: 35 mmol/L — AB (ref 19–32)
Calcium: 9.9 mg/dL (ref 8.4–10.5)
Chloride: 91 mmol/L — ABNORMAL LOW (ref 96–112)
Creatinine, Ser: 0.85 mg/dL (ref 0.50–1.10)
GFR calc non Af Amer: 80 mL/min — ABNORMAL LOW (ref >90)
GLUCOSE: 114 mg/dL — AB (ref 70–99)
Potassium: 2.7 mmol/L — CL (ref 3.5–5.1)
SODIUM: 137 mmol/L (ref 135–145)
Total Bilirubin: 1.1 mg/dL (ref 0.3–1.2)
Total Protein: 7.4 g/dL (ref 6.0–8.3)

## 2015-01-02 LAB — URINE MICROSCOPIC-ADD ON

## 2015-01-02 LAB — URINALYSIS, ROUTINE W REFLEX MICROSCOPIC
BILIRUBIN URINE: NEGATIVE
Glucose, UA: NEGATIVE mg/dL
Hgb urine dipstick: NEGATIVE
Ketones, ur: NEGATIVE mg/dL
NITRITE: NEGATIVE
PH: 6.5 (ref 5.0–8.0)
Protein, ur: NEGATIVE mg/dL
Specific Gravity, Urine: 1.013 (ref 1.005–1.030)
Urobilinogen, UA: 0.2 mg/dL (ref 0.0–1.0)

## 2015-01-02 MED ORDER — ONDANSETRON HCL 4 MG/2ML IJ SOLN
4.0000 mg | Freq: Once | INTRAMUSCULAR | Status: AC
  Administered 2015-01-02: 4 mg via INTRAVENOUS
  Filled 2015-01-02: qty 2

## 2015-01-02 MED ORDER — IOHEXOL 300 MG/ML  SOLN
25.0000 mL | Freq: Once | INTRAMUSCULAR | Status: AC | PRN
  Administered 2015-01-02: 25 mL via ORAL

## 2015-01-02 MED ORDER — POTASSIUM CHLORIDE 10 MEQ/100ML IV SOLN
10.0000 meq | Freq: Once | INTRAVENOUS | Status: AC
  Administered 2015-01-02: 10 meq via INTRAVENOUS
  Filled 2015-01-02: qty 100

## 2015-01-02 MED ORDER — MORPHINE SULFATE 4 MG/ML IJ SOLN
4.0000 mg | Freq: Once | INTRAMUSCULAR | Status: AC
  Administered 2015-01-02: 4 mg via INTRAVENOUS
  Filled 2015-01-02: qty 1

## 2015-01-02 MED ORDER — SODIUM CHLORIDE 0.9 % IV BOLUS (SEPSIS)
1000.0000 mL | Freq: Once | INTRAVENOUS | Status: AC
  Administered 2015-01-02: 1000 mL via INTRAVENOUS

## 2015-01-02 NOTE — ED Notes (Signed)
Patient presents with c/o severe right lower quad pain that started out of nowhere

## 2015-01-02 NOTE — ED Notes (Signed)
PA at BS.  

## 2015-01-02 NOTE — ED Provider Notes (Signed)
CSN: [id number]     Arrival date & time 01/02/15  2159 History   None    Chief Complaint  Patient presents with  . Abdominal Pain     (Consider location/radiation/quality/duration/timing/severity/associated sxs/prior Treatment) HPI Comments: Patient is a 49 year old female past medical history significant for bulimia, hyperlipidemia, hypokalemia presenting to the emergency department for acute onset right lower quadrant pain that began this evening with his daughter. She states her period is radiating to the rest of her abdomen currently. Describes it as severe sharp pain with no alleviating factors noted. No medications tried prior to arrival. Abdominal surgical history includes endometrial ablation.   Past Medical History  Diagnosis Date  . Bulimia   . Hyperlipidemia   . Hypothyroidism   . Hypokalemia   . Uterine polyp    Past Surgical History  Procedure Laterality Date  . Endometrial ablation  2009    per Dr. Louretta Shorten   . Lipoma excision      from right axilla   . Bunionectomy  1994    right foot   . Mandible reconstruction  1994   Family History  Problem Relation Age of Onset  . Breast cancer Maternal Grandmother   . Hyperlipidemia Maternal Grandfather   . Diabetes Maternal Grandfather    History  Substance Use Topics  . Smoking status: Never Smoker   . Smokeless tobacco: Never Used  . Alcohol Use: 1.0 oz/week    2 drink(s) per week   OB History    No data available     Review of Systems  Gastrointestinal: Positive for nausea and abdominal pain.  All other systems reviewed and are negative.     Allergies  Review of patient's allergies indicates no known allergies.  Home Medications   Prior to Admission medications   Medication Sig Start Date End Date Taking? Authorizing Provider  fish oil-omega-3 fatty acids 1000 MG capsule Take 1 g by mouth daily.     Yes Historical Provider, MD  Multiple Vitamins-Minerals (MULTIVITAMIN,TX-MINERALS) tablet Take 1  tablet by mouth daily.     Yes Historical Provider, MD  potassium chloride SA (K-DUR,KLOR-CON) 20 MEQ tablet TAKE 1 TABLET BY MOUTH DAILY. 06/13/14  Yes Laurey Morale, MD  SYNTHROID 100 MCG tablet TAKE 1 TABLET BY MOUTH DAILY. 07/27/14  Yes Laurey Morale, MD  HYDROcodone-acetaminophen (NORCO/VICODIN) 5-325 MG per tablet Take 1-2 tablets by mouth every 6 (six) hours as needed for severe pain. 01/03/15   Donnie Panik L Huxley Vanwagoner, PA-C  metroNIDAZOLE (FLAGYL) 500 MG tablet Take 1 tablet (500 mg total) by mouth 2 (two) times daily. 01/03/15   Kloe Oates L Montine Hight, PA-C  ondansetron (ZOFRAN ODT) 4 MG disintegrating tablet Take 1 tablet (4 mg total) by mouth every 8 (eight) hours as needed for nausea or vomiting. 01/03/15   Anderson Malta L Niaja Stickley, PA-C   BP 99/64 mmHg  Pulse 82  Temp(Src) 97.8 F (36.6 C) (Oral)  Resp 15  Ht 5\' 3"  (1.6 m)  Wt 122 lb (55.339 kg)  BMI 21.62 kg/m2  SpO2 94% Physical Exam  Constitutional: She is oriented to person, place, and time. She appears well-developed and well-nourished. No distress.  HENT:  Head: Normocephalic and atraumatic.  Right Ear: External ear normal.  Left Ear: External ear normal.  Nose: Nose normal.  Eyes: Conjunctivae are normal.  Neck: Neck supple.  Cardiovascular: Normal rate, regular rhythm and normal heart sounds.   Pulmonary/Chest: Effort normal and breath sounds normal.  Abdominal: Soft. Bowel sounds are normal.  She exhibits no distension. There is tenderness in the right lower quadrant. There is no rigidity, no rebound and no guarding.  Neurological: She is alert and oriented to person, place, and time.  Skin: Skin is warm and dry. She is not diaphoretic.  Nursing note and vitals reviewed.   Exam performed by Baron Sane L,  exam chaperoned Date: 01/03/2015 Pelvic exam: normal external genitalia without evidence of trauma. VULVA: normal appearing vulva with no masses, tenderness or lesion. VAGINA: normal appearing vagina with  normal color and discharge, no lesions. CERVIX:cervix with friability to right lower section (pt states she is aware from previous pelvic exams), cervical motion tenderness absent, cervical os closed with out purulent discharge; vaginal discharge - clear, Wet prep and DNA probe for chlamydia and GC obtained.   ADNEXA: normal adnexa in size, nontender and no masses UTERUS: uterus is normal size, shape, consistency and nontender.   ED Course  Procedures (including critical care time) Medications  potassium chloride 10 mEq in 100 mL IVPB (0 mEq Intravenous Stopped 01/03/15 0108)  sodium chloride 0.9 % bolus 1,000 mL (0 mLs Intravenous Stopped 01/03/15 0253)  morphine 4 MG/ML injection 4 mg (4 mg Intravenous Given 01/02/15 2356)  ondansetron (ZOFRAN) injection 4 mg (4 mg Intravenous Given 01/02/15 2355)  iohexol (OMNIPAQUE) 300 MG/ML solution 25 mL (25 mLs Oral Contrast Given 01/02/15 2351)  potassium chloride 10 mEq in 100 mL IVPB (0 mEq Intravenous Stopped 01/03/15 0212)  iohexol (OMNIPAQUE) 300 MG/ML solution 100 mL (100 mLs Intravenous Contrast Given 01/03/15 0056)  morphine 4 MG/ML injection 4 mg (4 mg Intravenous Given 01/03/15 0116)  cefTRIAXone (ROCEPHIN) injection 250 mg (250 mg Intramuscular Given 01/03/15 0321)  azithromycin (ZITHROMAX) tablet 1,000 mg (1,000 mg Oral Given 01/03/15 0321)  morphine 4 MG/ML injection 4 mg (4 mg Intravenous Given 01/03/15 0343)    Labs Review Labs Reviewed  WET PREP, GENITAL - Abnormal; Notable for the following:    Clue Cells Wet Prep HPF POC FEW (*)    WBC, Wet Prep HPF POC TOO NUMEROUS TO COUNT (*)    All other components within normal limits  COMPREHENSIVE METABOLIC PANEL - Abnormal; Notable for the following:    Potassium 2.7 (*)    Chloride 91 (*)    CO2 35 (*)    Glucose, Bld 114 (*)    GFR calc non Af Amer 80 (*)    All other components within normal limits  URINALYSIS, ROUTINE W REFLEX MICROSCOPIC - Abnormal; Notable for the following:     APPearance CLOUDY (*)    Leukocytes, UA SMALL (*)    All other components within normal limits  CBC WITH DIFFERENTIAL/PLATELET  URINE MICROSCOPIC-ADD ON  MAGNESIUM  GC/CHLAMYDIA PROBE AMP ()    Imaging Review Ct Abdomen Pelvis W Contrast  01/03/2015   CLINICAL DATA:  Generalized abdominal pain becoming severe in the right lower quadrant. Pain started around 7 p.m. Nausea without vomiting.  EXAM: CT ABDOMEN AND PELVIS WITH CONTRAST  TECHNIQUE: Multidetector CT imaging of the abdomen and pelvis was performed using the standard protocol following bolus administration of intravenous contrast.  CONTRAST:  127mL OMNIPAQUE IOHEXOL 300 MG/ML  SOLN  COMPARISON:  Ultrasound pelvis 04/06/2008  FINDINGS: Atelectasis in the lung bases. Suggestion of mild thickening in the lower esophageal wall which could indicate reflux disease.  Liver, spleen, gallbladder, pancreas, adrenal glands, kidneys, abdominal aorta, inferior vena cava, and retroperitoneal lymph nodes are unremarkable. Stomach, small bowel, and colon are not abnormally distended.  Stool fills the colon. No free air or free fluid in the abdomen. Small umbilical hernia containing fat.  Pelvis: Uterus and ovaries are not enlarged. Bladder wall is not thickened. Appendix is normal. No free or loculated pelvic fluid collections. No pelvic mass or lymphadenopathy. Normal alignment of the lumbar spine.  IMPRESSION: No acute process demonstrated in the abdomen or pelvis. Possible changes of reflux disease in the esophagus.   Electronically Signed   By: Lucienne Capers M.D.   On: 01/03/2015 01:15     EKG Interpretation None      MDM   Final diagnoses:  Abdominal pain in female  Abdominal pain in female    Filed Vitals:   01/03/15 0415  BP: 99/64  Pulse: 82  Temp:   Resp: 15   Afebrile, NAD, non-toxic appearing, AAOx4.  I have reviewed nursing notes, vital signs, and all appropriate lab and imaging results for this patient.  Patient  is nontoxic, nonseptic appearing, in no apparent distress.  Patient's pain and other symptoms adequately managed in emergency department.  Fluid bolus given.  Labs, imaging and vitals reviewed.  Patient does not meet the SIRS or Sepsis criteria. IV potassium given for repletion. Patient on current outpatient by mouth potassium replacement. Advised PCP recheck to ensure improvement. On repeat exam patient does not have a surgical abdomen and there are no peritoneal signs. CT scan unremarkable, no cause noted for abdominal pain. No indication of appendicitis, bowel obstruction, bowel perforation, cholecystitis, diverticulitis, PID or ectopic pregnancy. Given numerous WBCs on wet prep with RLQ abdominal pain will prophylactically treat with azithromycin and Rocephin. Will start patient on Flagyl for BV. Advised patient to receive a call in 48-72 hours regarding the test results of her GC and chlamydia. Exam is not concerning for PID, no cervical motion tenderness, adnexal fullness or tenderness. Patient discharged home with symptomatic treatment and given strict instructions for follow-up with their primary care physician.  I have also discussed reasons to return immediately to the ER.  Patient expresses understanding and agrees with plan. Patient is stable at time of discharge        Gari Crown 01/03/15 [id number]  April K Palumbo-Rasch, MD 01/03/15 [phone]

## 2015-01-02 NOTE — ED Notes (Signed)
CT aware pt has finished drinking contrast

## 2015-01-03 ENCOUNTER — Encounter (HOSPITAL_COMMUNITY): Payer: Self-pay

## 2015-01-03 ENCOUNTER — Emergency Department (HOSPITAL_COMMUNITY): Payer: 59

## 2015-01-03 LAB — GC/CHLAMYDIA PROBE AMP (~~LOC~~) NOT AT ARMC
CHLAMYDIA, DNA PROBE: NEGATIVE
Neisseria Gonorrhea: NEGATIVE

## 2015-01-03 LAB — WET PREP, GENITAL
TRICH WET PREP: NONE SEEN
Yeast Wet Prep HPF POC: NONE SEEN

## 2015-01-03 LAB — MAGNESIUM: Magnesium: 1.8 mg/dL (ref 1.5–2.5)

## 2015-01-03 MED ORDER — MORPHINE SULFATE 4 MG/ML IJ SOLN
4.0000 mg | Freq: Once | INTRAMUSCULAR | Status: AC
  Administered 2015-01-03: 4 mg via INTRAVENOUS
  Filled 2015-01-03: qty 1

## 2015-01-03 MED ORDER — POTASSIUM CHLORIDE 10 MEQ/100ML IV SOLN
10.0000 meq | Freq: Once | INTRAVENOUS | Status: AC
  Administered 2015-01-03: 10 meq via INTRAVENOUS
  Filled 2015-01-03: qty 100

## 2015-01-03 MED ORDER — ONDANSETRON 4 MG PO TBDP: 4.0000 mg | ORAL_TABLET | Freq: Three times a day (TID) | ORAL | Status: DC | PRN

## 2015-01-03 MED ORDER — IOHEXOL 300 MG/ML  SOLN
100.0000 mL | Freq: Once | INTRAMUSCULAR | Status: AC | PRN
  Administered 2015-01-03: 100 mL via INTRAVENOUS

## 2015-01-03 MED ORDER — HYDROCODONE-ACETAMINOPHEN 5-325 MG PO TABS: 1.0000 | ORAL_TABLET | Freq: Four times a day (QID) | ORAL | Status: DC | PRN

## 2015-01-03 MED ORDER — METRONIDAZOLE 500 MG PO TABS: 500.0000 mg | ORAL_TABLET | Freq: Two times a day (BID) | ORAL | Status: DC

## 2015-01-03 MED ORDER — CEFTRIAXONE SODIUM 250 MG IJ SOLR
250.0000 mg | Freq: Once | INTRAMUSCULAR | Status: AC
  Administered 2015-01-03: 250 mg via INTRAMUSCULAR
  Filled 2015-01-03: qty 250

## 2015-01-03 MED ORDER — AZITHROMYCIN 250 MG PO TABS
1000.0000 mg | ORAL_TABLET | Freq: Once | ORAL | Status: AC
  Administered 2015-01-03: 1000 mg via ORAL
  Filled 2015-01-03: qty 4

## 2015-01-03 NOTE — ED Notes (Signed)
Anise Salvo, PA-C to report pain, she is already aware. No new orders at this time.

## 2015-01-03 NOTE — Discharge Instructions (Signed)
Please follow up with your primary care physician in 1-2 days. If you do not have one please call the [facility] number listed above. Please follow up with your Ob/Gyn or [facility] above to schedule a follow up appointment.  Please take your antibiotic until completion. Please read all discharge instructions and return precautions.    Abdominal Pain, Women Abdominal (stomach, pelvic, or belly) pain can be caused by many things. It is important to tell your doctor:  The location of the pain.  Does it come and go or is it present all the time?  Are there things that start the pain (eating certain foods, exercise)?  Are there other symptoms associated with the pain (fever, nausea, vomiting, diarrhea)? All of this is helpful to know when trying to find the cause of the pain. CAUSES   Stomach: virus or bacteria infection, or ulcer.  Intestine: appendicitis (inflamed appendix), regional ileitis (Crohn's disease), ulcerative colitis (inflamed colon), irritable bowel syndrome, diverticulitis (inflamed diverticulum of the colon), or cancer of the stomach or intestine.  Gallbladder disease or stones in the gallbladder.  Kidney disease, kidney stones, or infection.  Pancreas infection or cancer.  Fibromyalgia (pain disorder).  Diseases of the female organs:  Uterus: fibroid (non-cancerous) tumors or infection.  Fallopian tubes: infection or tubal pregnancy.  Ovary: cysts or tumors.  Pelvic adhesions (scar tissue).  Endometriosis (uterus lining tissue growing in the pelvis and on the pelvic organs).  Pelvic congestion syndrome (female organs filling up with blood just before the menstrual period).  Pain with the menstrual period.  Pain with ovulation (producing an egg).  Pain with an IUD (intrauterine device, birth control) in the uterus.  Cancer of the female organs.  Functional pain (pain not caused by a disease, may improve without treatment).  Psychological  pain.  Depression. DIAGNOSIS  Your doctor will decide the seriousness of your pain by doing an examination.  Blood tests.  X-rays.  Ultrasound.  CT scan (computed tomography, special type of X-ray).  MRI (magnetic resonance imaging).  Cultures, for infection.  Barium enema (dye inserted in the large intestine, to better view it with X-rays).  Colonoscopy (looking in intestine with a lighted tube).  Laparoscopy (minor surgery, looking in abdomen with a lighted tube).  Major abdominal exploratory surgery (looking in abdomen with a large incision). TREATMENT  The treatment will depend on the cause of the pain.   Many cases can be observed and treated at home.  Over-the-counter medicines recommended by your caregiver.  Prescription medicine.  Antibiotics, for infection.  Birth control pills, for painful periods or for ovulation pain.  Hormone treatment, for endometriosis.  Nerve blocking injections.  Physical therapy.  Antidepressants.  Counseling with a psychologist or psychiatrist.  Minor or major surgery. HOME CARE INSTRUCTIONS   Do not take laxatives, unless directed by your caregiver.  Take over-the-counter pain medicine only if ordered by your caregiver. Do not take aspirin because it can cause an upset stomach or bleeding.  Try a clear liquid diet (broth or water) as ordered by your caregiver. Slowly move to a bland diet, as tolerated, if the pain is related to the stomach or intestine.  Have a thermometer and take your temperature several times a day, and record it.  Bed rest and sleep, if it helps the pain.  Avoid sexual intercourse, if it causes pain.  Avoid stressful situations.  Keep your follow-up appointments and tests, as your caregiver orders.  If the pain does not  go away with medicine or surgery, you may try:  Acupuncture.  Relaxation exercises (yoga, meditation).  Group therapy.  Counseling. SEEK MEDICAL CARE IF:   You  notice certain foods cause stomach pain.  Your home care treatment is not helping your pain.  You need stronger pain medicine.  You want your IUD removed.  You feel faint or lightheaded.  You develop nausea and vomiting.  You develop a rash.  You are having side effects or an allergy to your medicine. SEEK IMMEDIATE MEDICAL CARE IF:   Your pain does not go away or gets worse.  You have a fever.  Your pain is felt only in portions of the abdomen. The right side could possibly be appendicitis. The left lower portion of the abdomen could be colitis or diverticulitis.  You are passing blood in your stools (bright red or black tarry stools, with or without vomiting).  You have blood in your urine.  You develop chills, with or without a fever.  You pass out. MAKE SURE YOU:   Understand these instructions.  Will watch your condition.  Will get help right away if you are not doing well or get worse. Document Released: 08/25/2007 Document Revised: 03/14/2014 Document Reviewed: 09/14/2009 [facility] Patient Information 2015 [place], [place]. This information is not intended to replace advice given to you by your health care provider. Make sure you discuss any questions you have with your health care provider.

## 2015-01-04 ENCOUNTER — Ambulatory Visit (INDEPENDENT_AMBULATORY_CARE_PROVIDER_SITE_OTHER): Payer: 59 | Admitting: Family Medicine

## 2015-01-04 ENCOUNTER — Encounter: Payer: Self-pay | Admitting: Family Medicine

## 2015-01-04 ENCOUNTER — Emergency Department (HOSPITAL_COMMUNITY)
Admission: EM | Admit: 2015-01-04 | Discharge: 2015-01-04 | Disposition: A | Discharge status: Home / Self Care | Payer: 59 | Attending: Emergency Medicine | Admitting: Emergency Medicine

## 2015-01-04 ENCOUNTER — Encounter (HOSPITAL_COMMUNITY): Payer: Self-pay | Admitting: Emergency Medicine

## 2015-01-04 VITALS — BP 106/69 | HR 87 | Temp 99.9°F | Ht 63.0 in | Wt 126.0 lb

## 2015-01-04 DIAGNOSIS — R509 Fever, unspecified: Secondary | ICD-10-CM | POA: Diagnosis not present

## 2015-01-04 DIAGNOSIS — Z8742 Personal history of other diseases of the female genital tract: Secondary | ICD-10-CM | POA: Diagnosis not present

## 2015-01-04 DIAGNOSIS — Z792 Long term (current) use of antibiotics: Secondary | ICD-10-CM | POA: Diagnosis not present

## 2015-01-04 DIAGNOSIS — R1031 Right lower quadrant pain: Secondary | ICD-10-CM

## 2015-01-04 DIAGNOSIS — E785 Hyperlipidemia, unspecified: Secondary | ICD-10-CM | POA: Diagnosis not present

## 2015-01-04 DIAGNOSIS — R1013 Epigastric pain: Secondary | ICD-10-CM | POA: Insufficient documentation

## 2015-01-04 DIAGNOSIS — Z79899 Other long term (current) drug therapy: Secondary | ICD-10-CM | POA: Insufficient documentation

## 2015-01-04 DIAGNOSIS — Z8659 Personal history of other mental and behavioral disorders: Secondary | ICD-10-CM | POA: Diagnosis not present

## 2015-01-04 DIAGNOSIS — E039 Hypothyroidism, unspecified: Secondary | ICD-10-CM | POA: Diagnosis not present

## 2015-01-04 DIAGNOSIS — Z8619 Personal history of other infectious and parasitic diseases: Secondary | ICD-10-CM | POA: Insufficient documentation

## 2015-01-04 DIAGNOSIS — E876 Hypokalemia: Secondary | ICD-10-CM | POA: Insufficient documentation

## 2015-01-04 LAB — COMPREHENSIVE METABOLIC PANEL
ALT: 23 U/L (ref 0–35)
AST: 32 U/L (ref 0–37)
Albumin: 3.7 g/dL (ref 3.5–5.2)
Alkaline Phosphatase: 51 U/L (ref 39–117)
Anion gap: 7 (ref 5–15)
BUN: 6 mg/dL (ref 6–23)
CALCIUM: 9 mg/dL (ref 8.4–10.5)
CO2: 30 mmol/L (ref 19–32)
CREATININE: 0.97 mg/dL (ref 0.50–1.10)
Chloride: 102 mmol/L (ref 96–112)
GFR, EST AFRICAN AMERICAN: 79 mL/min — AB (ref >90)
GFR, EST NON AFRICAN AMERICAN: 68 mL/min — AB (ref >90)
GLUCOSE: 96 mg/dL (ref 70–99)
Potassium: 3.3 mmol/L — ABNORMAL LOW (ref 3.5–5.1)
SODIUM: 139 mmol/L (ref 135–145)
Total Bilirubin: 0.9 mg/dL (ref 0.3–1.2)
Total Protein: 6.5 g/dL (ref 6.0–8.3)

## 2015-01-04 LAB — LIPASE, BLOOD: LIPASE: 29 U/L (ref 11–59)

## 2015-01-04 LAB — CBC WITH DIFFERENTIAL/PLATELET
Basophils Absolute: 0 10*3/uL (ref 0.0–0.1)
Basophils Relative: 0 % (ref 0–1)
EOS PCT: 1 % (ref 0–5)
Eosinophils Absolute: 0.1 10*3/uL (ref 0.0–0.7)
HCT: 35.8 % — ABNORMAL LOW (ref 36.0–46.0)
Hemoglobin: 12.5 g/dL (ref 12.0–15.0)
LYMPHS ABS: 1.1 10*3/uL (ref 0.7–4.0)
Lymphocytes Relative: 18 % (ref 12–46)
MCH: 31.4 pg (ref 26.0–34.0)
MCHC: 34.9 g/dL (ref 30.0–36.0)
MCV: 89.9 fL (ref 78.0–100.0)
Monocytes Absolute: 0.5 10*3/uL (ref 0.1–1.0)
Monocytes Relative: 8 % (ref 3–12)
NEUTROS PCT: 73 % (ref 43–77)
Neutro Abs: 4.4 10*3/uL (ref 1.7–7.7)
Platelets: 185 10*3/uL (ref 150–400)
RBC: 3.98 MIL/uL (ref 3.87–5.11)
RDW: 12.6 % (ref 11.5–15.5)
WBC: 6.1 10*3/uL (ref 4.0–10.5)

## 2015-01-04 LAB — URINALYSIS, ROUTINE W REFLEX MICROSCOPIC
Bilirubin Urine: NEGATIVE
Glucose, UA: NEGATIVE mg/dL
Hgb urine dipstick: NEGATIVE
Ketones, ur: 15 mg/dL — AB
Leukocytes, UA: NEGATIVE
Nitrite: NEGATIVE
PH: 8 (ref 5.0–8.0)
PROTEIN: NEGATIVE mg/dL
Specific Gravity, Urine: 1.008 (ref 1.005–1.030)
Urobilinogen, UA: 0.2 mg/dL (ref 0.0–1.0)

## 2015-01-04 MED ORDER — POTASSIUM CHLORIDE CRYS ER 20 MEQ PO TBCR
40.0000 meq | EXTENDED_RELEASE_TABLET | Freq: Once | ORAL | Status: AC
  Administered 2015-01-04: 40 meq via ORAL
  Filled 2015-01-04: qty 2

## 2015-01-04 MED ORDER — DICYCLOMINE HCL 20 MG PO TABS: 20.0000 mg | ORAL_TABLET | Freq: Two times a day (BID) | ORAL | Status: DC

## 2015-01-04 MED ORDER — GI COCKTAIL ~~LOC~~
30.0000 mL | Freq: Once | ORAL | Status: AC
  Administered 2015-01-04: 30 mL via ORAL
  Filled 2015-01-04: qty 30

## 2015-01-04 MED ORDER — ONDANSETRON HCL 4 MG PO TABS: 4.0000 mg | ORAL_TABLET | Freq: Four times a day (QID) | ORAL | Status: DC

## 2015-01-04 MED ORDER — ESOMEPRAZOLE MAGNESIUM 40 MG PO CPDR: 40.0000 mg | DELAYED_RELEASE_CAPSULE | Freq: Every day | ORAL | Status: DC

## 2015-01-04 MED ORDER — SUCRALFATE 1 G PO TABS: 1.0000 g | ORAL_TABLET | Freq: Three times a day (TID) | ORAL | Status: DC

## 2015-01-04 MED ORDER — DICYCLOMINE HCL 10 MG PO CAPS
10.0000 mg | ORAL_CAPSULE | Freq: Once | ORAL | Status: AC
Start: 2015-01-04 — End: 2015-01-04
  Administered 2015-01-04: 10 mg via ORAL
  Filled 2015-01-04: qty 1

## 2015-01-04 NOTE — Discharge Instructions (Signed)

## 2015-01-04 NOTE — Progress Notes (Signed)
   Subjective:    Patient ID: Tamara Brewer, female    DOB: 02-Sep-1966, 49 y.o.   MRN: [id number]  HPI Here for 3 days of severe RLQ pains that radiate up to the umbilical area. She has had nausea and anorexia but has not vomited. At first she had no fever but now she is febrile. She was seen at the ED on 01-02-15 where her exam showed abdominal tenderness but no rebound or guarding. Her CBC was normal and a CT scan was unremarkable. A pelvic exam was unremarkable but a vaginal wet prep showed TNTC white blood cells. GC and Chlamydia were negative. She was given Rocephin and Azithromycin and was sent home on oral Flagyl. She had a small BM last night. No urinary sx. She is taking fluids but not eating food. Today she still has the same abdominal pain and now has a fever.    Review of Systems  Constitutional: Positive for fever.  Respiratory: Negative.   Cardiovascular: Negative.   Gastrointestinal: Positive for nausea and abdominal pain. Negative for vomiting, diarrhea, constipation, blood in stool, abdominal distention, anal bleeding and rectal pain.  Genitourinary: Negative.        Objective:   Physical Exam  Constitutional:  In  Pain, stooped over because standing up straight is too painful   Cardiovascular: Normal rate, regular rhythm, normal heart sounds and intact distal pulses.   Pulmonary/Chest: Effort normal and breath sounds normal.  Abdominal: Soft. Bowel sounds are normal. She exhibits no distension and no mass.  She is very tender in the RLQ and around the umbilicus. She has involuntary guarding and positive rebound tenderness.           Assessment & Plan:  She is getting worse by exam and now has a fever despite taking antibiotics. I am still concerned that she could have appendicitis. After consulting with Surgery, they advised that we send her back to [facility] ER so that Surgery can be consulted to see her there. She is able to drive herself.

## 2015-01-04 NOTE — ED Notes (Signed)
Pt arrived by POV from PCP office. Pt was seen here Monday night for abd pain and dx with vaginal infection. Went to PCP to follow up and pt stated that pain has decreased since Monday night but now has a fever and was told to come to ED for GI consult. Also c/o h/a today that stated this morning. Stated that she had a filling in tooth fall out and visited the dentist yesterday and replaced filling with temporary crown.

## 2015-01-04 NOTE — ED Provider Notes (Signed)
CSN: [id number]     Arrival date & time 01/04/15  1010 History   First MD Initiated Contact with Patient 01/04/15 1018     Chief Complaint  Patient presents with  . Abdominal Pain     (Consider location/radiation/quality/duration/timing/severity/associated sxs/prior Treatment) HPI  Tamara Brewer is a(n) 49 y.o. female who presents to the ED with a cc of abdominal pain. The patient was seen 2 days ago for same complaint. She was diagnosed with BV and treated for a CT and chlamydia as well. He is negative for any infectious disease. Patient had a negative CT of the abdomen, however, did show some possible changes of long-term reflux. The patient has a past medical history of bulimia. She states that she has not been vomiting for the past couple days and that she has been trying to decrease to vomiting only once daily, however, has had it for many years and is not under the care of any therapist. The patient was found to have a potassium of 2.7 at her last visit as well. Patient states that her abdominal pain began 2 days ago suddenly. She states complains that it felt like abdominal wall spasm along with colicky internal cramping pain. She states it is diffuse and currently epigastric. She states the pain also radiates to the bilateral upper quadrants. The patient fellow followed up with her primary care physician today, her PCP found her to have a mildly elevated temperature at 99.9. Patient states she awoke with a temperature of 100.6 orally this morning. Patient denies any urinary or vaginal symptoms. She's continued to take her Flagyl. She denies any nausea, vomiting, constipation or diarrhea.   Past Medical History  Diagnosis Date  . Bulimia   . Hyperlipidemia   . Hypothyroidism   . Hypokalemia   . Uterine polyp    Past Surgical History  Procedure Laterality Date  . Endometrial ablation  2009    per Dr. Louretta Shorten   . Lipoma excision      from right axilla   . Bunionectomy  1994   right foot   . Mandible reconstruction  1994   Family History  Problem Relation Age of Onset  . Breast cancer Maternal Grandmother   . Hyperlipidemia Maternal Grandfather   . Diabetes Maternal Grandfather    History  Substance Use Topics  . Smoking status: Never Smoker   . Smokeless tobacco: Never Used  . Alcohol Use: 1.2 oz/week    2 Standard drinks or equivalent per week     Comment: socially   OB History    No data available     Review of Systems  Unable to perform ROS Constitutional: Positive for fever.  HENT: Negative for trouble swallowing.   Respiratory: Negative for shortness of breath.   Cardiovascular: Negative for chest pain.  Gastrointestinal: Positive for abdominal pain. Negative for nausea, vomiting, diarrhea and constipation.  Genitourinary: Negative for dysuria and hematuria.  Musculoskeletal: Negative for myalgias and arthralgias.  Skin: Negative for rash.  Neurological: Negative for numbness.  All other systems reviewed and are negative.     Allergies  Review of patient's allergies indicates no known allergies.  Home Medications   Prior to Admission medications   Medication Sig Start Date End Date Taking? Authorizing Provider  dicyclomine (BENTYL) 20 MG tablet Take 1 tablet (20 mg total) by mouth 2 (two) times daily. 01/04/15   Margarita Mail, PA-C  esomeprazole (NEXIUM) 40 MG capsule Take 1 capsule (40 mg total) by mouth  daily. 01/04/15   Margarita Mail, PA-C  fish oil-omega-3 fatty acids 1000 MG capsule Take 1 g by mouth daily.      Historical Provider, MD  HYDROcodone-acetaminophen (NORCO/VICODIN) 5-325 MG per tablet Take 1-2 tablets by mouth every 6 (six) hours as needed for severe pain. 01/03/15   Jennifer L Piepenbrink, PA-C  metroNIDAZOLE (FLAGYL) 500 MG tablet Take 1 tablet (500 mg total) by mouth 2 (two) times daily. 01/03/15   Jennifer L Piepenbrink, PA-C  Multiple Vitamins-Minerals (MULTIVITAMIN,TX-MINERALS) tablet Take 1 tablet by mouth  daily.      Historical Provider, MD  ondansetron (ZOFRAN ODT) 4 MG disintegrating tablet Take 1 tablet (4 mg total) by mouth every 8 (eight) hours as needed for nausea or vomiting. Patient not taking: Reported on 01/04/2015 01/03/15   Anderson Malta L Piepenbrink, PA-C  ondansetron (ZOFRAN) 4 MG tablet Take 1 tablet (4 mg total) by mouth every 6 (six) hours. 01/04/15   Margarita Mail, PA-C  polyethylene glycol powder (GLYCOLAX/MIRALAX) powder Take 1 Container by mouth as needed.    Historical Provider, MD  potassium chloride SA (K-DUR,KLOR-CON) 20 MEQ tablet TAKE 1 TABLET BY MOUTH DAILY. 06/13/14   Laurey Morale, MD  Probiotic Product (PROBIOTIC DAILY PO) Take by mouth.    Historical Provider, MD  sucralfate (CARAFATE) 1 G tablet Take 1 tablet (1 g total) by mouth 4 (four) times daily -  with meals and at bedtime. 01/04/15   Ciro Tashiro, PA-C  SYNTHROID 100 MCG tablet TAKE 1 TABLET BY MOUTH DAILY. 07/27/14   Laurey Morale, MD   BP 102/56 mmHg  Pulse 77  Temp(Src) 98.4 F (36.9 C) (Oral)  Resp 16  SpO2 98% Physical Exam  Constitutional: She is oriented to person, place, and time. She appears well-developed and well-nourished. No distress.  HENT:  Head: Normocephalic and atraumatic.  Eyes: Conjunctivae are normal. No scleral icterus.  Neck: Normal range of motion.  Cardiovascular: Normal rate, regular rhythm and normal heart sounds.  Exam reveals no gallop and no friction rub.   No murmur heard. Pulmonary/Chest: Breath sounds normal.  Abdominal: Soft. Bowel sounds are normal. She exhibits no distension and no mass. There is tenderness (epigastric). There is no guarding.  Neurological: She is alert and oriented to person, place, and time.  Skin: Skin is warm and dry. She is not diaphoretic.  Psychiatric: Her behavior is normal.  Nursing note and vitals reviewed.   ED Course  Procedures (including critical care time) Labs Review Labs Reviewed  CBC WITH DIFFERENTIAL/PLATELET - Abnormal; Notable  for the following:    HCT 35.8 (*)    All other components within normal limits  COMPREHENSIVE METABOLIC PANEL - Abnormal; Notable for the following:    Potassium 3.3 (*)    GFR calc non Af Amer 68 (*)    GFR calc Af Amer 79 (*)    All other components within normal limits  URINALYSIS, ROUTINE W REFLEX MICROSCOPIC - Abnormal; Notable for the following:    Ketones, ur 15 (*)    All other components within normal limits  LIPASE, BLOOD    Imaging Review No results found.   EKG Interpretation None      MDM   Final diagnoses:  Epigastric abdominal pain  Hypokalemia   BP 102/56 mmHg  Pulse 77  Temp(Src) 98.4 F (36.9 C) (Oral)  Resp 16  SpO2 98% 1:50 PM Patient with epigastric abdominal pain and hx of bullimia. I suspect gastritis. Ill check labs and urine.  +  bentyl and GI cocktail.    Patient with apparent gastritis. Patient is nontoxic, nonseptic appearing, in no apparent distress.  Patient's pain and other symptoms adequately managed in emergency department.  Fluid bolus given.  Labs, imaging and vitals reviewed.  Patient does not meet the SIRS or Sepsis criteria.  On repeat exam patient does not have a surgical abdomin and there are no peritoneal signs.  No indication of appendicitis, bowel obstruction, bowel perforation, cholecystitis, diverticulitis, PID or ectopic pregnancy.  Patient discharged home with symptomatic treatment and given strict instructions for follow-up with their primary care physician.  I have also discussed reasons to return immediately to the ER.  Patient expresses understanding and agrees with plan.    Meds ordered this encounter  Medications  . dicyclomine (BENTYL) capsule 10 mg    Sig:   . gi cocktail (Maalox,Lidocaine,Donnatal)    Sig:   . potassium chloride SA (K-DUR,KLOR-CON) CR tablet 40 mEq    Sig:   . esomeprazole (NEXIUM) 40 MG capsule    Sig: Take 1 capsule (40 mg total) by mouth daily.    Dispense:  30 capsule    Refill:  0     Order Specific Question:  Supervising Provider    Answer:  Noemi Chapel D [[id number]]  . ondansetron (ZOFRAN) 4 MG tablet    Sig: Take 1 tablet (4 mg total) by mouth every 6 (six) hours.    Dispense:  12 tablet    Refill:  0    Order Specific Question:  Supervising Provider    Answer:  Noemi Chapel D [[id number]]  . dicyclomine (BENTYL) 20 MG tablet    Sig: Take 1 tablet (20 mg total) by mouth 2 (two) times daily.    Dispense:  20 tablet    Refill:  0    Order Specific Question:  Supervising Provider    Answer:  Noemi Chapel D [[id number]]  . sucralfate (CARAFATE) 1 G tablet    Sig: Take 1 tablet (1 g total) by mouth 4 (four) times daily -  with meals and at bedtime.    Dispense:  90 tablet    Refill:  0    Order Specific Question:  Supervising Provider    Answer:  Noemi Chapel D [[id number]]      Medication List    TAKE these medications        dicyclomine 20 MG tablet  Commonly known as:  BENTYL  Take 1 tablet (20 mg total) by mouth 2 (two) times daily.     esomeprazole 40 MG capsule  Commonly known as:  NEXIUM  Take 1 capsule (40 mg total) by mouth daily.     ondansetron 4 MG tablet  Commonly known as:  ZOFRAN  Take 1 tablet (4 mg total) by mouth every 6 (six) hours.     sucralfate 1 G tablet  Commonly known as:  CARAFATE  Take 1 tablet (1 g total) by mouth 4 (four) times daily -  with meals and at bedtime.      ASK your doctor about these medications        fish oil-omega-3 fatty acids 1000 MG capsule  Take 1 g by mouth daily.     HYDROcodone-acetaminophen 5-325 MG per tablet  Commonly known as:  NORCO/VICODIN  Take 1-2 tablets by mouth every 6 (six) hours as needed for severe pain.     metroNIDAZOLE 500 MG tablet  Commonly known as:  FLAGYL  Take 1 tablet (500 mg total) by  mouth 2 (two) times daily.     multivitamin,tx-minerals tablet  Take 1 tablet by mouth daily.     ondansetron 4 MG disintegrating tablet  Commonly known as:  ZOFRAN ODT  Take 1 tablet (4 mg total) by  mouth every 8 (eight) hours as needed for nausea or vomiting.     polyethylene glycol powder powder  Commonly known as:  GLYCOLAX/MIRALAX  Take 1 Container by mouth as needed.     potassium chloride SA 20 MEQ tablet  Commonly known as:  K-DUR,KLOR-CON  TAKE 1 TABLET BY MOUTH DAILY.     PROBIOTIC DAILY PO  Take by mouth.     SYNTHROID 100 MCG tablet  Generic drug:  levothyroxine  TAKE 1 TABLET BY MOUTH DAILY.         Margarita Mail, PA-C 01/11/15 1352  Carmin Muskrat, MD 01/12/15 [phone]

## 2015-01-04 NOTE — Progress Notes (Signed)
Pre visit review using our clinic review tool, if applicable. No additional management support is needed unless otherwise documented below in the visit note. 

## 2015-04-14 ENCOUNTER — Telehealth: Payer: Self-pay | Admitting: Family Medicine

## 2015-04-14 DIAGNOSIS — E038 Other specified hypothyroidism: Secondary | ICD-10-CM

## 2015-04-14 NOTE — Telephone Encounter (Signed)
Can you schedule lab appointment first?

## 2015-04-14 NOTE — Telephone Encounter (Signed)
Patient would like to have her thyroid level checked, she thinks it may be off.  How would you like for me to schedule the appointment?  Can she have a lab appointment first then see Dr. Sarajane Jews?

## 2015-04-14 NOTE — Telephone Encounter (Signed)
I went ahead and put in future orders for her thyroid levels. Then she can see me after that to go over results

## 2015-04-14 NOTE — Telephone Encounter (Signed)
LMOM for pt.  Please refer to below response for scheduling directions.

## 2015-04-21 ENCOUNTER — Other Ambulatory Visit: Payer: Self-pay | Admitting: Family Medicine

## 2015-04-28 ENCOUNTER — Other Ambulatory Visit (INDEPENDENT_AMBULATORY_CARE_PROVIDER_SITE_OTHER): Payer: 59

## 2015-04-28 DIAGNOSIS — E038 Other specified hypothyroidism: Secondary | ICD-10-CM

## 2015-04-28 LAB — TSH: TSH: 1.63 u[IU]/mL (ref 0.35–4.50)

## 2015-04-28 LAB — T4, FREE: Free T4: 0.97 ng/dL (ref 0.60–1.60)

## 2015-04-28 LAB — T3, FREE: T3 FREE: 3.1 pg/mL (ref 2.3–4.2)

## 2015-06-05 ENCOUNTER — Other Ambulatory Visit (INDEPENDENT_AMBULATORY_CARE_PROVIDER_SITE_OTHER): Payer: 59

## 2015-06-05 DIAGNOSIS — Z0000 Encounter for general adult medical examination without abnormal findings: Secondary | ICD-10-CM

## 2015-06-05 LAB — BASIC METABOLIC PANEL
BUN: 15 mg/dL (ref 6–23)
CALCIUM: 10.1 mg/dL (ref 8.4–10.5)
CO2: 33 meq/L — AB (ref 19–32)
CREATININE: 0.85 mg/dL (ref 0.40–1.20)
Chloride: 97 mEq/L (ref 96–112)
GFR: 75.75 mL/min (ref >60.00)
GLUCOSE: 89 mg/dL (ref 70–99)
POTASSIUM: 4.3 meq/L (ref 3.5–5.1)
Sodium: 140 mEq/L (ref 135–145)

## 2015-06-05 LAB — POCT URINALYSIS DIPSTICK
BILIRUBIN UA: NEGATIVE
Blood, UA: NEGATIVE
GLUCOSE UA: NEGATIVE
Ketones, UA: NEGATIVE
Leukocytes, UA: NEGATIVE
Nitrite, UA: NEGATIVE
Spec Grav, UA: 1.015
Urobilinogen, UA: 0.2
pH, UA: >=9

## 2015-06-05 LAB — CBC WITH DIFFERENTIAL/PLATELET
BASOS ABS: 0 10*3/uL (ref 0.0–0.1)
Basophils Relative: 0.9 % (ref 0.0–3.0)
EOS ABS: 0.2 10*3/uL (ref 0.0–0.7)
EOS PCT: 3 % (ref 0.0–5.0)
HCT: 40.7 % (ref 36.0–46.0)
HEMOGLOBIN: 14.1 g/dL (ref 12.0–15.0)
LYMPHS ABS: 2 10*3/uL (ref 0.7–4.0)
Lymphocytes Relative: 36.4 % (ref 12.0–46.0)
MCHC: 34.5 g/dL (ref 30.0–36.0)
MCV: 90.2 fl (ref 78.0–100.0)
MONO ABS: 0.5 10*3/uL (ref 0.1–1.0)
Monocytes Relative: 8.5 % (ref 3.0–12.0)
NEUTROS ABS: 2.8 10*3/uL (ref 1.4–7.7)
Neutrophils Relative %: 51.2 % (ref 43.0–77.0)
Platelets: 252 10*3/uL (ref 150.0–400.0)
RBC: 4.51 Mil/uL (ref 3.87–5.11)
RDW: 13 % (ref 11.5–15.5)
WBC: 5.4 10*3/uL (ref 4.0–10.5)

## 2015-06-05 LAB — LIPID PANEL
Cholesterol: 240 mg/dL — ABNORMAL HIGH (ref 0–200)
HDL: 68.8 mg/dL (ref >39.00)
LDL CALC: 140 mg/dL — AB (ref 0–99)
NonHDL: 171.2
TRIGLYCERIDES: 154 mg/dL — AB (ref 0.0–149.0)
Total CHOL/HDL Ratio: 3
VLDL: 30.8 mg/dL (ref 0.0–40.0)

## 2015-06-05 LAB — HEPATIC FUNCTION PANEL
ALBUMIN: 4.5 g/dL (ref 3.5–5.2)
ALT: 36 U/L — AB (ref 0–35)
AST: 31 U/L (ref 0–37)
Alkaline Phosphatase: 69 U/L (ref 39–117)
BILIRUBIN DIRECT: 0.1 mg/dL (ref 0.0–0.3)
BILIRUBIN TOTAL: 0.7 mg/dL (ref 0.2–1.2)
TOTAL PROTEIN: 7.1 g/dL (ref 6.0–8.3)

## 2015-06-05 LAB — TSH: TSH: 0.82 u[IU]/mL (ref 0.35–4.50)

## 2015-06-15 ENCOUNTER — Encounter: Payer: Self-pay | Admitting: Family Medicine

## 2015-06-15 ENCOUNTER — Ambulatory Visit (INDEPENDENT_AMBULATORY_CARE_PROVIDER_SITE_OTHER): Payer: 59 | Admitting: Family Medicine

## 2015-06-15 VITALS — BP 96/63 | HR 66 | Temp 98.6°F | Ht 63.75 in | Wt 123.0 lb

## 2015-06-15 DIAGNOSIS — Z0000 Encounter for general adult medical examination without abnormal findings: Secondary | ICD-10-CM | POA: Diagnosis not present

## 2015-06-15 DIAGNOSIS — E785 Hyperlipidemia, unspecified: Secondary | ICD-10-CM | POA: Insufficient documentation

## 2015-06-15 MED ORDER — LEVOTHYROXINE SODIUM 100 MCG PO TABS: 100.0000 ug | ORAL_TABLET | Freq: Every day | ORAL | Status: DC

## 2015-06-15 MED ORDER — ESCITALOPRAM OXALATE 10 MG PO TABS: 10.0000 mg | ORAL_TABLET | Freq: Every day | ORAL | Status: DC

## 2015-06-15 MED ORDER — POTASSIUM CHLORIDE CRYS ER 20 MEQ PO TBCR: EXTENDED_RELEASE_TABLET | ORAL | Status: DC

## 2015-06-15 NOTE — Progress Notes (Signed)
   Subjective:    Patient ID: Tamara Brewer, female    DOB: [birth year], 48 y.o.   MRN: [id number]  HPI 49 yr old female for a cpx. She watches her diet and exercises. Despite this her lipid levels have steadily climbed over the past few years. She also thinks she is going through menopause because she has a lot of mood swings, she gets irriitable, and she has trouble sleeping at times. She has had an ablation so she does not have vaginal bleeding any more.   Review of Systems  Constitutional: Negative.   HENT: Negative.   Eyes: Negative.   Respiratory: Negative.   Cardiovascular: Negative.   Gastrointestinal: Negative.   Genitourinary: Negative for dysuria, urgency, frequency, hematuria, flank pain, decreased urine volume, enuresis, difficulty urinating, pelvic pain and dyspareunia.  Musculoskeletal: Negative.   Skin: Negative.   Neurological: Negative.   Psychiatric/Behavioral: Positive for sleep disturbance. Negative for hallucinations, confusion, dysphoric mood and decreased concentration. The patient is nervous/anxious.        Objective:   Physical Exam  Constitutional: She is oriented to person, place, and time. She appears well-developed and well-nourished. No distress.  HENT:  Head: Normocephalic and atraumatic.  Right Ear: External ear normal.  Left Ear: External ear normal.  Nose: Nose normal.  Mouth/Throat: Oropharynx is clear and moist. No oropharyngeal exudate.  Eyes: Conjunctivae and EOM are normal. Pupils are equal, round, and reactive to light. No scleral icterus.  Neck: Normal range of motion. Neck supple. No JVD present. No thyromegaly present.  Cardiovascular: Normal rate, regular rhythm, normal heart sounds and intact distal pulses.  Exam reveals no gallop and no friction rub.   No murmur heard. Pulmonary/Chest: Effort normal and breath sounds normal. No respiratory distress. She has no wheezes. She has no rales. She exhibits no tenderness.  Abdominal: Soft.  Bowel sounds are normal. She exhibits no distension and no mass. There is no tenderness. There is no rebound and no guarding.  Musculoskeletal: Normal range of motion. She exhibits no edema or tenderness.  Lymphadenopathy:    She has no cervical adenopathy.  Neurological: She is alert and oriented to person, place, and time. She has normal reflexes. No cranial nerve deficit. She exhibits normal muscle tone. Coordination normal.  Skin: Skin is warm and dry. No rash noted. No erythema.  Psychiatric: She has a normal mood and affect. Her behavior is normal. Judgment and thought content normal.          Assessment & Plan:  Well exam. She has a visit to er GYN coming up and she will discuss possible hormonal treatments for menopause, but we will start her on Lexapro 10 mg daily for the mood swings. Refer to Nutrition to help manage the lipids.

## 2015-06-15 NOTE — Progress Notes (Signed)
Pre visit review using our clinic review tool, if applicable. No additional management support is needed unless otherwise documented below in the visit note. 

## 2015-07-14 ENCOUNTER — Encounter: Payer: Self-pay | Admitting: Dietician

## 2015-07-14 ENCOUNTER — Encounter: Payer: 59 | Attending: Family Medicine | Admitting: Dietician

## 2015-07-14 VITALS — Ht 64.0 in | Wt 127.0 lb

## 2015-07-14 DIAGNOSIS — E785 Hyperlipidemia, unspecified: Secondary | ICD-10-CM | POA: Insufficient documentation

## 2015-07-14 DIAGNOSIS — Z713 Dietary counseling and surveillance: Secondary | ICD-10-CM | POA: Diagnosis not present

## 2015-07-14 NOTE — Patient Instructions (Signed)
Continue your exercise regime! Eat plenty of plant based foods and small amounts of lean protein. Read labels for saturated fat and limit. Be mindful about your eating/ appetite and satiety.

## 2015-07-14 NOTE — Progress Notes (Signed)
  Medical Nutrition Therapy:  Appt start time: 0800 end time:  0900.   Assessment:  Primary concerns today: Patient is here alone.  Her primary concern is hyperlipidemia.  06/05/15 labs:  Chol:  240, Trig:  154, HDL:  68.8, LDL:  140.  Weight 127 lbs. UBW 125 lbs.  Recently started Lexapro to even mood/sleep due to perimenopause.  Hx also includes hypothyroid for approximately 6 years.  Labs WNL.  Patient lives with husband, 47 yo son and 67 year daughter.   Patient does the shopping and the cooking.  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  Preferred Learning Style:   No preference indicated   Learning Readiness:     Ready  Change in progress  MEDICATIONS: see list   DIETARY INTAKE: 24-hr recall:  B ( AM): yogurt with flax seeds, almonds, fruit, coffee with creamer OR bagel with LF cream cheese once a week Snk ( AM): carrots and humus  L ( PM): (packs) cod with protein vege mix OR salmon with jasmine rice Snk ( PM): fruit or granola bar D ( PM): homemade hamburger, baked french fries, occasional cookie, cake, or chocolate OR vege pizza once per week Snk ( PM): pretzels or ice cream Beverages:  Coke zero (1 per day), seltzer, water, beer (3) or wine on weekends  Usual physical activity: exercise classes (spin, yoga) @ [employer], paddle boarding. Daily 30 minutes eliptical at work, 3 miles walking dog per day  Estimated energy needs: 1800-2000 calories 55-65 g protein  Progress Towards Goal(s):  In progress.   Nutritional Diagnosis:  NB-1.1 Food and nutrition-related knowledge deficit As related to dietary managment of hyperlipidemia.  As evidenced by patient report.    Intervention:  Nutrition counseling/education related to dietary management of hyperlipidemia.  Focus on increasing plant based products with lean meats, decrease saturated fat, option of adding soy, plant sterols/stanols, or omega 3.  Benefits of continued exercise..  Continue your exercise regime! Eat plenty of plant  based foods and small amounts of lean protein. Read labels for saturated fat and limit. Be mindful about your eating/ appetite and satiety.  Teaching Method Utilized:  Visual Auditory  Handouts given during visit include:  Mediterranean diet  AND plant sterols/stanols  AND soy protein  AND omega 3  AND TLC hear healthy guidelines  Label reading  Barriers to learning/adherence to lifestyle change: none  Demonstrated degree of understanding via:  Teach Back   Monitoring/Evaluation:  Dietary intake, exercise, label reading, and body weight prn.

## 2015-08-07 ENCOUNTER — Other Ambulatory Visit: Payer: Self-pay | Admitting: Obstetrics and Gynecology

## 2015-08-08 LAB — CYTOLOGY - PAP

## 2015-12-25 MED FILL — ESCITALOPRAM 10 MG TABLET: 10 | 90 days supply | Qty: 90 | Fill #2

## 2016-01-23 MED FILL — SYNTHROID 100 MCG TABLET: 100 | 90 days supply | Qty: 90 | Fill #3

## 2016-02-05 MED FILL — POTASSIUM CL ER 20 MEQ TAB: 20 | 90 days supply | Qty: 90 | Fill #2

## 2016-03-28 ENCOUNTER — Telehealth: Payer: 59 | Admitting: Physician Assistant

## 2016-03-28 DIAGNOSIS — J069 Acute upper respiratory infection, unspecified: Secondary | ICD-10-CM

## 2016-03-28 DIAGNOSIS — B9789 Other viral agents as the cause of diseases classified elsewhere: Principal | ICD-10-CM

## 2016-03-28 MED ORDER — BENZONATATE 100 MG PO CAPS: 100.0000 mg | ORAL_CAPSULE | Freq: Three times a day (TID) | ORAL | Status: DC | PRN

## 2016-03-28 MED FILL — BENZONATATE 100 MG CAPSULE: 100 | 10 days supply | Qty: 30 | Fill #0

## 2016-03-28 NOTE — Progress Notes (Signed)
We are sorry that you are not feeling well.  Here is how we plan to help!  Based on what you have shared with me it looks like you have upper respiratory tract inflammation that has resulted in a significant cough.  Inflammation and infection in the upper respiratory tract is commonly called bronchitis and has four common causes:  Allergies, Viral Infections, Acid Reflux and Bacterial Infections.  Allergies, viruses and acid reflux are treated by controlling symptoms or eliminating the cause. An example might be a cough caused by taking certain blood pressure medications. You stop the cough by changing the medication. Another example might be a cough caused by acid reflux. Controlling the reflux helps control the cough.  Based on your presentation I believe you most likely have A cough due to a virus.  This is called viral bronchitis and is best treated by rest, plenty of fluids and control of the cough.  You may use Ibuprofen or Tylenol as directed to help your symptoms.    In addition you may use A prescription cough medication called Tessalon Perles 100mg. You may take 1-2 capsules every 8 hours as needed for your cough.    HOME CARE . Only take medications as instructed by your medical team. . Complete the entire course of an antibiotic. . Drink plenty of fluids and get plenty of rest. . Avoid close contacts especially the very young and the elderly . Cover your mouth if you cough or cough into your sleeve. . Always remember to wash your hands . A steam or ultrasonic humidifier can help congestion.    GET HELP RIGHT AWAY IF: . You develop worsening fever. . You become short of breath . You cough up blood. . Your symptoms persist after you have completed your treatment plan MAKE SURE YOU   Understand these instructions.  Will watch your condition.  Will get help right away if you are not doing well or get worse.  Your e-visit answers were reviewed by a board certified advanced  clinical practitioner to complete your personal care plan.  Depending on the condition, your plan could have included both over the counter or prescription medications. If there is a problem please reply  once you have received a response from your provider. Your safety is important to us.  If you have drug allergies check your prescription carefully.    You can use MyChart to ask questions about today's visit, request a non-urgent call back, or ask for a work or school excuse for 24 hours related to this e-Visit. If it has been greater than 24 hours you will need to follow up with your provider, or enter a new e-Visit to address those concerns. You will get an e-mail in the next two days asking about your experience.  I hope that your e-visit has been valuable and will speed your recovery. Thank you for using e-visits.   

## 2016-03-29 MED FILL — ESCITALOPRAM 10 MG TABLET: 10 | 90 days supply | Qty: 90 | Fill #3

## 2016-03-30 ENCOUNTER — Encounter: Payer: Self-pay | Admitting: Emergency Medicine

## 2016-03-30 ENCOUNTER — Emergency Department
Admission: EM | Admit: 2016-03-30 | Discharge: 2016-03-30 | Disposition: A | Discharge status: Home / Self Care | Payer: 59 | Source: Home / Self Care | Attending: Family Medicine | Admitting: Family Medicine

## 2016-03-30 DIAGNOSIS — J069 Acute upper respiratory infection, unspecified: Secondary | ICD-10-CM | POA: Diagnosis not present

## 2016-03-30 DIAGNOSIS — J019 Acute sinusitis, unspecified: Secondary | ICD-10-CM | POA: Diagnosis not present

## 2016-03-30 MED ORDER — DEXAMETHASONE SODIUM PHOSPHATE 10 MG/ML IJ SOLN
10.0000 mg | Freq: Once | INTRAMUSCULAR | Status: AC
  Administered 2016-03-30: 10 mg via INTRAMUSCULAR

## 2016-03-30 MED ORDER — AMOXICILLIN-POT CLAVULANATE 875-125 MG PO TABS: 1.0000 | ORAL_TABLET | Freq: Two times a day (BID) | ORAL | Status: DC

## 2016-03-30 NOTE — ED Provider Notes (Signed)
CSN: [id number]     Arrival date & time 03/30/16  0941 History   First MD Initiated Contact with Patient 03/30/16 [phone]     Chief Complaint  Patient presents with  . Facial Pain   (Consider location/radiation/quality/duration/timing/severity/associated sxs/prior Treatment) HPI  The pt is a 50yo female presenting to [facility] with c/o worsening sinus congestion, pressure, frontal headache, bilateral ear pain and watery eyes for 1 week.  She was dx with a viral URI via e-visit and prescribed tessalon but states that sinus pressure has only worsened.  Symptoms are moderate to severe in nature.  She has tried OTC cough/cold/sinus medication w/o relief. Denies fever, chills, n/v/d.   Past Medical History  Diagnosis Date  . Bulimia   . Hyperlipidemia   . Hypothyroidism   . Hypokalemia   . Uterine polyp    Past Surgical History  Procedure Laterality Date  . Endometrial ablation  2009    per Dr. Louretta Shorten   . Lipoma excision      from right axilla   . Bunionectomy  1994    right foot   . Mandible reconstruction  1994   Family History  Problem Relation Age of Onset  . Breast cancer Maternal Grandmother   . Hyperlipidemia Maternal Grandfather   . Diabetes Maternal Grandfather    Social History  Substance Use Topics  . Smoking status: Never Smoker   . Smokeless tobacco: Never Used  . Alcohol Use: 1.2 oz/week    2 Standard drinks or equivalent per week     Comment: socially   OB History    No data available     Review of Systems  Constitutional: Negative for fever and chills.  HENT: Positive for congestion, ear pain, rhinorrhea, sinus pressure, sneezing and sore throat. Negative for trouble swallowing and voice change.   Respiratory: Positive for cough. Negative for shortness of breath.   Cardiovascular: Negative for chest pain and palpitations.  Gastrointestinal: Negative for nausea, vomiting, abdominal pain and diarrhea.  Musculoskeletal: Negative for myalgias, back pain and  arthralgias.  Skin: Negative for rash.  Neurological: Positive for headaches. Negative for dizziness and light-headedness.  All other systems reviewed and are negative.   Allergies  Review of patient's allergies indicates no known allergies.  Home Medications   Prior to Admission medications   Medication Sig Start Date End Date Taking? Authorizing Provider  amoxicillin-clavulanate (AUGMENTIN) 875-125 MG tablet Take 1 tablet by mouth 2 (two) times daily. For 7 days 03/30/16   Noland Fordyce, PA-C  benzonatate (TESSALON) 100 MG capsule Take 1 capsule (100 mg total) by mouth 3 (three) times daily as needed for cough. 03/28/16   Brunetta Jeans, PA-C  escitalopram (LEXAPRO) 10 MG tablet Take 1 tablet (10 mg total) by mouth daily. 06/15/15   Laurey Morale, MD  levothyroxine (SYNTHROID) 100 MCG tablet Take 1 tablet (100 mcg total) by mouth daily. 06/15/15   Laurey Morale, MD   Meds Ordered and Administered this Visit   Medications  dexamethasone (DECADRON) injection 10 mg (10 mg Intramuscular Given 03/30/16 1022)    BP 110/71 mmHg  Pulse 66  Temp(Src) 98.4 F (36.9 C) (Oral)  Ht 5' 3.75" (1.619 m)  Wt 125 lb 8 oz (56.926 kg)  BMI 21.72 kg/m2  SpO2 100% No data found.   Physical Exam  Constitutional: She appears well-developed and well-nourished. No distress.  HENT:  Head: Normocephalic and atraumatic.  Right Ear: Tympanic membrane normal.  Left Ear: Tympanic membrane normal.  Nose: Mucosal edema present. Right sinus exhibits maxillary sinus tenderness and frontal sinus tenderness. Left sinus exhibits maxillary sinus tenderness and frontal sinus tenderness.  Mouth/Throat: Uvula is midline, oropharynx is clear and moist and mucous membranes are normal.  Eyes: Conjunctivae are normal. No scleral icterus.  Neck: Normal range of motion. Neck supple.  Cardiovascular: Normal rate, regular rhythm and normal heart sounds.   Pulmonary/Chest: Effort normal and breath sounds normal. No stridor.  No respiratory distress. She has no wheezes. She has no rales.  Musculoskeletal: Normal range of motion.  Lymphadenopathy:    She has no cervical adenopathy.  Neurological: She is alert.  Skin: Skin is warm and dry. She is not diaphoretic.  Nursing note and vitals reviewed.   ED Course  Procedures (including critical care time)  Labs Review Labs Reviewed - No data to display  Imaging Review No results found.    MDM   1. Acute rhinosinusitis   2. Acute upper respiratory infection    Pt c/o worsening sinus pain and congestion for 1 week despite using OTC medications and Tessalon.  Sinus tenderness noted on exam.  Pt also requesting a shot of steroids to help with the pain.  Tx in UC: decadron 10mg  IM  Rx: Augmentin  Advised pt to use acetaminophen and ibuprofen as needed for fever and pain. Encouraged rest, fluids, and sinus rinses. F/u with PCP in 7-10 days if not improving, sooner if worsening. Pt verbalized understanding and agreement with tx plan.    Noland Fordyce, PA-C 03/30/16 1037

## 2016-03-30 NOTE — Discharge Instructions (Signed)
You may take 400-600mg  Ibuprofen (Motrin) every 6-8 hours for fever and pain  Alternate with Tylenol  You may take 500mg  Tylenol every 4-6 hours as needed for fever and pain  Follow-up with your primary care provider next week for recheck of symptoms if not improving.  Be sure to drink plenty of fluids and rest, at least 8hrs of sleep a night, preferably more while you are sick. Return urgent care or go to closest ER if you cannot keep down fluids/signs of dehydration, fever not reducing with Tylenol, difficulty breathing/wheezing, stiff neck, worsening condition, or other concerns (see below)  Please take antibiotics as prescribed and be sure to complete entire course even if you start to feel better to ensure infection does not come back.   Cool Mist Vaporizers Vaporizers may help relieve the symptoms of a cough and cold. They add moisture to the air, which helps mucus to become thinner and less sticky. This makes it easier to breathe and cough up secretions. Cool mist vaporizers do not cause serious burns like hot mist vaporizers, which may also be called steamers or humidifiers. Vaporizers have not been proven to help with colds. You should not use a vaporizer if you are allergic to mold. HOME CARE INSTRUCTIONS  Follow the package instructions for the vaporizer.  Do not use anything other than distilled water in the vaporizer.  Do not run the vaporizer all of the time. This can cause mold or bacteria to grow in the vaporizer.  Clean the vaporizer after each time it is used.  Clean and dry the vaporizer well before storing it.  Stop using the vaporizer if worsening respiratory symptoms develop.   This information is not intended to replace advice given to you by your health care provider. Make sure you discuss any questions you have with your health care provider.   Document Released: 07/25/2004 Document Revised: 11/02/2013 Document Reviewed: 03/17/2013 Elsevier Interactive Patient  Education 2016 [employer].  Sinus Rinse WHAT IS A SINUS RINSE? A sinus rinse is a simple home treatment that is used to rinse your sinuses with a sterile mixture of salt and water (saline solution). Sinuses are air-filled spaces in your skull behind the bones of your face and forehead that open into your nasal cavity. You will use the following:  Saline solution.  Neti pot or spray bottle. This releases the saline solution into your nose and through your sinuses. Neti pots and spray bottles can be purchased at [employer], a health food store, or online. WHEN WOULD I DO A SINUS RINSE? A sinus rinse can help to clear mucus, dirt, dust, or pollen from the nasal cavity. You may do a sinus rinse when you have a cold, a virus, nasal allergy symptoms, a sinus infection, or stuffiness in the nose or sinuses. If you are considering a sinus rinse:  Ask your child's health care provider before performing a sinus rinse on your child.  Do not do a sinus rinse if you have had ear or nasal surgery, ear infection, or blocked ears. HOW DO I DO A SINUS RINSE?  Wash your hands.  Disinfect your device according to the directions provided and then dry it.  Use the solution that comes with your device or one that is sold separately in stores. Follow the mixing directions on the package.  Fill your device with the amount of saline solution as directed by the device instructions.  Stand over a sink and tilt your head sideways over  the sink.  Place the spout of the device in your upper nostril (the one closer to the ceiling).  Gently pour or squeeze the saline solution into the nasal cavity. The liquid should drain to the lower nostril if you are not overly congested.  Gently blow your nose. Blowing too hard may cause ear pain.  Repeat in the other nostril.  Clean and rinse your device with clean water and then air-dry it. ARE THERE RISKS OF A SINUS RINSE?  Sinus rinse is generally very  safe and effective. However, there are a few risks, which include:   A burning sensation in the sinuses. This may happen if you do not make the saline solution as directed. Make sure to follow all directions when making the saline solution.  Infection from contaminated water. This is rare, but possible.  Nasal irritation.   This information is not intended to replace advice given to you by your health care provider. Make sure you discuss any questions you have with your health care provider.   Document Released: 05/25/2014 Document Reviewed: 05/25/2014 Elsevier Interactive Patient Education [employer].

## 2016-03-30 NOTE — ED Notes (Signed)
Pt c/o head congestion, sinus pressure, watery eyes, bilateral ear pain, headache

## 2016-04-24 MED FILL — SYNTHROID 100 MCG TABLET: 100 | 90 days supply | Qty: 90 | Fill #0

## 2016-04-26 MED FILL — ESTRACE 0.01% CREAM: 0.1 | 30 days supply | Qty: 43 | Fill #0

## 2016-05-07 MED FILL — KLOR-CON M20 TABLET: 20 | 90 days supply | Qty: 90 | Fill #3

## 2016-06-13 ENCOUNTER — Other Ambulatory Visit: Payer: Self-pay | Admitting: Obstetrics and Gynecology

## 2016-06-13 ENCOUNTER — Other Ambulatory Visit: Payer: Self-pay | Admitting: Family Medicine

## 2016-06-13 DIAGNOSIS — Z1231 Encounter for screening mammogram for malignant neoplasm of breast: Secondary | ICD-10-CM

## 2016-06-20 ENCOUNTER — Ambulatory Visit
Admission: RE | Admit: 2016-06-20 | Discharge: 2016-06-20 | Disposition: A | Discharge status: Home / Self Care | Payer: 59 | Source: Ambulatory Visit | Attending: Obstetrics and Gynecology | Admitting: Obstetrics and Gynecology

## 2016-06-20 DIAGNOSIS — Z1231 Encounter for screening mammogram for malignant neoplasm of breast: Secondary | ICD-10-CM | POA: Diagnosis not present

## 2016-06-28 ENCOUNTER — Other Ambulatory Visit (INDEPENDENT_AMBULATORY_CARE_PROVIDER_SITE_OTHER): Payer: 59

## 2016-06-28 DIAGNOSIS — Z0000 Encounter for general adult medical examination without abnormal findings: Secondary | ICD-10-CM

## 2016-06-28 LAB — HEPATIC FUNCTION PANEL
ALT: 21 U/L (ref 0–35)
AST: 24 U/L (ref 0–37)
Albumin: 4.7 g/dL (ref 3.5–5.2)
Alkaline Phosphatase: 68 U/L (ref 39–117)
BILIRUBIN DIRECT: 0.1 mg/dL (ref 0.0–0.3)
BILIRUBIN TOTAL: 0.5 mg/dL (ref 0.2–1.2)
Total Protein: 7.1 g/dL (ref 6.0–8.3)

## 2016-06-28 LAB — POC URINALSYSI DIPSTICK (AUTOMATED)
BILIRUBIN UA: NEGATIVE
Blood, UA: NEGATIVE
Glucose, UA: NEGATIVE
Ketones, UA: NEGATIVE
LEUKOCYTES UA: NEGATIVE
Nitrite, UA: NEGATIVE
SPEC GRAV UA: 1.015
Urobilinogen, UA: 0.2
pH, UA: 8.5

## 2016-06-28 LAB — BASIC METABOLIC PANEL
BUN: 14 mg/dL (ref 6–23)
CO2: 28 mEq/L (ref 19–32)
CREATININE: 0.84 mg/dL (ref 0.40–1.20)
Calcium: 9.6 mg/dL (ref 8.4–10.5)
Chloride: 103 mEq/L (ref 96–112)
GFR: 76.46 mL/min (ref >60.00)
GLUCOSE: 77 mg/dL (ref 70–99)
Potassium: 4.3 mEq/L (ref 3.5–5.1)
Sodium: 138 mEq/L (ref 135–145)

## 2016-06-28 LAB — CBC WITH DIFFERENTIAL/PLATELET
Basophils Absolute: 0 10*3/uL (ref 0.0–0.1)
Basophils Relative: 1.1 % (ref 0.0–3.0)
EOS ABS: 0.1 10*3/uL (ref 0.0–0.7)
Eosinophils Relative: 3.1 % (ref 0.0–5.0)
HCT: 38.2 % (ref 36.0–46.0)
HEMOGLOBIN: 13.4 g/dL (ref 12.0–15.0)
LYMPHS ABS: 1.5 10*3/uL (ref 0.7–4.0)
Lymphocytes Relative: 35.4 % (ref 12.0–46.0)
MCHC: 35 g/dL (ref 30.0–36.0)
MCV: 89.2 fl (ref 78.0–100.0)
Monocytes Absolute: 0.4 10*3/uL (ref 0.1–1.0)
Monocytes Relative: 8.6 % (ref 3.0–12.0)
NEUTROS PCT: 51.8 % (ref 43.0–77.0)
Neutro Abs: 2.2 10*3/uL (ref 1.4–7.7)
Platelets: 244 10*3/uL (ref 150.0–400.0)
RBC: 4.29 Mil/uL (ref 3.87–5.11)
RDW: 12.9 % (ref 11.5–15.5)
WBC: 4.2 10*3/uL (ref 4.0–10.5)

## 2016-06-28 LAB — LIPID PANEL
CHOLESTEROL: 201 mg/dL — AB (ref 0–200)
HDL: 71.2 mg/dL (ref >39.00)
LDL CALC: 99 mg/dL (ref 0–99)
NonHDL: 129.43
Total CHOL/HDL Ratio: 3
Triglycerides: 151 mg/dL — ABNORMAL HIGH (ref 0.0–149.0)
VLDL: 30.2 mg/dL (ref 0.0–40.0)

## 2016-06-28 LAB — TSH: TSH: 1.01 u[IU]/mL (ref 0.35–4.50)

## 2016-07-01 ENCOUNTER — Ambulatory Visit (INDEPENDENT_AMBULATORY_CARE_PROVIDER_SITE_OTHER): Payer: 59 | Admitting: Family Medicine

## 2016-07-01 ENCOUNTER — Encounter: Payer: Self-pay | Admitting: Family Medicine

## 2016-07-01 VITALS — BP 102/70 | HR 60 | Temp 97.9°F | Ht 63.25 in | Wt 128.3 lb

## 2016-07-01 DIAGNOSIS — Z0000 Encounter for general adult medical examination without abnormal findings: Secondary | ICD-10-CM

## 2016-07-01 MED ORDER — ESCITALOPRAM OXALATE 10 MG PO TABS: 10.0000 mg | ORAL_TABLET | Freq: Every day | ORAL | 3 refills | Status: DC

## 2016-07-01 MED ORDER — LEVOTHYROXINE SODIUM 100 MCG PO TABS: 100.0000 ug | ORAL_TABLET | Freq: Every day | ORAL | 3 refills | Status: DC

## 2016-07-01 MED FILL — ESCITALOPRAM 10 MG TABLET: 10 | 90 days supply | Qty: 90 | Fill #0

## 2016-07-01 NOTE — Progress Notes (Signed)
   Subjective:    Patient ID: Tamara Brewer, female    DOB: [birth year], 50 y.o.   MRN: [id number]  HPI 50 yr old female for a well exam. She feels good. She has changed her diet and now her lipids are all at goal.    Review of Systems  Constitutional: Negative.   HENT: Negative.   Eyes: Negative.   Respiratory: Negative.   Cardiovascular: Negative.   Gastrointestinal: Negative.   Genitourinary: Negative for decreased urine volume, difficulty urinating, dyspareunia, dysuria, enuresis, flank pain, frequency, hematuria, pelvic pain and urgency.  Musculoskeletal: Negative.   Skin: Negative.   Neurological: Negative.   Psychiatric/Behavioral: Negative.        Objective:   Physical Exam  Constitutional: She is oriented to person, place, and time. She appears well-developed and well-nourished. No distress.  HENT:  Head: Normocephalic and atraumatic.  Right Ear: External ear normal.  Left Ear: External ear normal.  Nose: Nose normal.  Mouth/Throat: Oropharynx is clear and moist. No oropharyngeal exudate.  Eyes: Conjunctivae and EOM are normal. Pupils are equal, round, and reactive to light. No scleral icterus.  Neck: Normal range of motion. Neck supple. No JVD present. No thyromegaly present.  Cardiovascular: Normal rate, regular rhythm, normal heart sounds and intact distal pulses.  Exam reveals no gallop and no friction rub.   No murmur heard. Pulmonary/Chest: Effort normal and breath sounds normal. No respiratory distress. She has no wheezes. She has no rales. She exhibits no tenderness.  Abdominal: Soft. Bowel sounds are normal. She exhibits no distension and no mass. There is no tenderness. There is no rebound and no guarding.  Musculoskeletal: Normal range of motion. She exhibits no edema or tenderness.  Lymphadenopathy:    She has no cervical adenopathy.  Neurological: She is alert and oriented to person, place, and time. She has normal reflexes. No cranial nerve deficit. She  exhibits normal muscle tone. Coordination normal.  Skin: Skin is warm and dry. No rash noted. No erythema.  Psychiatric: She has a normal mood and affect. Her behavior is normal. Judgment and thought content normal.          Assessment & Plan:  Well exam. We discussed diet and exercise.  Laurey Morale, MD

## 2016-07-17 MED FILL — SYNTHROID 100 MCG TABLET: 100 | 90 days supply | Qty: 90 | Fill #0

## 2016-08-08 DIAGNOSIS — Z6822 Body mass index (BMI) 22.0-22.9, adult: Secondary | ICD-10-CM | POA: Diagnosis not present

## 2016-08-08 DIAGNOSIS — N951 Menopausal and female climacteric states: Secondary | ICD-10-CM | POA: Diagnosis not present

## 2016-08-08 DIAGNOSIS — Z01419 Encounter for gynecological examination (general) (routine) without abnormal findings: Secondary | ICD-10-CM | POA: Diagnosis not present

## 2016-08-08 MED FILL — MINIVELLE 0.1 MG PATCH: 0.1 | 28 days supply | Qty: 8 | Fill #0

## 2016-08-08 MED FILL — PROGESTERONE 100 MG CAPSULE: 100 | 30 days supply | Qty: 30 | Fill #0

## 2016-08-26 ENCOUNTER — Other Ambulatory Visit: Payer: Self-pay | Admitting: Family Medicine

## 2016-08-27 MED FILL — KLOR-CON M20 TABLET: 20 | 90 days supply | Qty: 90 | Fill #0

## 2016-09-13 MED FILL — MINIVELLE 0.1 MG PATCH: 0.1 | 28 days supply | Qty: 8 | Fill #1

## 2016-09-13 MED FILL — PROGESTERONE 100 MG CAPSULE: 100 | 30 days supply | Qty: 30 | Fill #1

## 2016-09-27 DIAGNOSIS — H5213 Myopia, bilateral: Secondary | ICD-10-CM | POA: Diagnosis not present

## 2016-09-27 DIAGNOSIS — H524 Presbyopia: Secondary | ICD-10-CM | POA: Diagnosis not present

## 2016-09-27 DIAGNOSIS — H52223 Regular astigmatism, bilateral: Secondary | ICD-10-CM | POA: Diagnosis not present

## 2016-10-14 MED FILL — PROGESTERONE 100 MG CAPSULE: 100 | 30 days supply | Qty: 30 | Fill #2

## 2016-10-14 MED FILL — ESCITALOPRAM 10 MG TABLET: 10 | 90 days supply | Qty: 90 | Fill #1

## 2016-10-14 MED FILL — MINIVELLE 0.1 MG PATCH: 0.1 | 28 days supply | Qty: 8 | Fill #2

## 2016-10-14 MED FILL — SYNTHROID 100 MCG TABLET: 100 | 90 days supply | Qty: 90 | Fill #1

## 2016-11-18 MED FILL — MINIVELLE 0.1 MG PATCH: 0.1 | 28 days supply | Qty: 8 | Fill #3

## 2016-11-18 MED FILL — KLOR-CON M20 TABLET: 20 | 90 days supply | Qty: 90 | Fill #1

## 2016-11-18 MED FILL — PROGESTERONE 100 MG CAPSULE: 100 | 30 days supply | Qty: 30 | Fill #3

## 2016-12-17 MED FILL — MINIVELLE 0.1 MG PATCH: 0.1 | 28 days supply | Qty: 8 | Fill #4

## 2016-12-17 MED FILL — PROGESTERONE 100 MG CAPSULE: 100 | 30 days supply | Qty: 30 | Fill #4

## 2017-01-13 MED FILL — SYNTHROID 100 MCG TABLET: 100 | 90 days supply | Qty: 90 | Fill #2

## 2017-01-20 MED FILL — MINIVELLE 0.1 MG PATCH: 0.1 | 28 days supply | Qty: 8 | Fill #5

## 2017-01-20 MED FILL — PROGESTERONE 100 MG CAPSULE: 100 | 30 days supply | Qty: 30 | Fill #5

## 2017-01-20 MED FILL — ESCITALOPRAM 10 MG TABLET: 10 | 90 days supply | Qty: 90 | Fill #2

## 2017-02-26 MED FILL — PROGESTERONE 100 MG CAPSULE: 100 | 30 days supply | Qty: 30 | Fill #6

## 2017-02-26 MED FILL — POTASSIUM CL ER 20 MEQ TABL: 20 | 90 days supply | Qty: 90 | Fill #2

## 2017-02-26 MED FILL — MINIVELLE 0.1 MG PATCH: 0.1 | 28 days supply | Qty: 8 | Fill #6

## 2017-02-28 DIAGNOSIS — L710 Perioral dermatitis: Secondary | ICD-10-CM | POA: Diagnosis not present

## 2017-02-28 MED FILL — metroNIDAZOLE 0.75 % CREA: 0.75 | 20 days supply | Qty: 45 | Fill #0

## 2017-03-31 MED FILL — MINIVELLE 0.1 MG PATCH: 0.1 | 28 days supply | Qty: 8 | Fill #7

## 2017-03-31 MED FILL — PROGESTERONE 100 MG CAPSULE: 100 | 30 days supply | Qty: 30 | Fill #7

## 2017-04-10 MED FILL — SYNTHROID 100 MCG TABLET: 100 | 90 days supply | Qty: 90 | Fill #3

## 2017-05-05 MED FILL — PROGESTERONE 100 MG CAPSULE: 100 | 30 days supply | Qty: 30 | Fill #8

## 2017-05-05 MED FILL — ESCITALOPRAM 10 MG TABLET: 10 | 90 days supply | Qty: 90 | Fill #3

## 2017-05-05 MED FILL — MINIVELLE 0.1 MG PATCH: 0.1 | 28 days supply | Qty: 8 | Fill #8

## 2017-06-04 MED FILL — PROGESTERONE 100 MG CAPSULE: 100 | 30 days supply | Qty: 30 | Fill #9

## 2017-06-04 MED FILL — POTASSIUM CL ER 20 MEQ TABL: 20 | 90 days supply | Qty: 90 | Fill #3

## 2017-06-13 MED FILL — MINIVELLE 0.1 MG PATCH: 0.1 | 28 days supply | Qty: 8 | Fill #9

## 2017-06-23 ENCOUNTER — Other Ambulatory Visit: Payer: Self-pay | Admitting: Family Medicine

## 2017-06-23 DIAGNOSIS — Z1231 Encounter for screening mammogram for malignant neoplasm of breast: Secondary | ICD-10-CM

## 2017-07-04 ENCOUNTER — Ambulatory Visit
Admission: RE | Admit: 2017-07-04 | Discharge: 2017-07-04 | Disposition: A | Discharge status: Home / Self Care | Payer: 59 | Source: Ambulatory Visit | Attending: Family Medicine | Admitting: Family Medicine

## 2017-07-04 DIAGNOSIS — Z1231 Encounter for screening mammogram for malignant neoplasm of breast: Secondary | ICD-10-CM

## 2017-07-15 ENCOUNTER — Other Ambulatory Visit: Payer: Self-pay | Admitting: Family Medicine

## 2017-07-15 MED FILL — MINIVELLE 0.1 MG PATCH: 0.1 | 28 days supply | Qty: 8 | Fill #10

## 2017-07-15 MED FILL — PROGESTERONE 100 MG CAPSULE: 100 | 30 days supply | Qty: 30 | Fill #10

## 2017-07-16 MED FILL — SYNTHROID 100 MCG TABLET: 100 | 90 days supply | Qty: 90 | Fill #0

## 2017-07-27 ENCOUNTER — Emergency Department
Admission: EM | Admit: 2017-07-27 | Discharge: 2017-07-27 | Disposition: A | Discharge status: Home / Self Care | Payer: 59 | Source: Home / Self Care | Attending: Family Medicine | Admitting: Family Medicine

## 2017-07-27 ENCOUNTER — Encounter: Payer: Self-pay | Admitting: Emergency Medicine

## 2017-07-27 DIAGNOSIS — B029 Zoster without complications: Secondary | ICD-10-CM | POA: Diagnosis not present

## 2017-07-27 MED ORDER — VALACYCLOVIR HCL 1 G PO TABS: 1000.0000 mg | ORAL_TABLET | Freq: Three times a day (TID) | ORAL | 0 refills | Status: DC

## 2017-07-27 NOTE — ED Provider Notes (Signed)
[facility]    CSN: [id number] Arrival date & time: 07/27/17  1157     History   Chief Complaint Chief Complaint  Patient presents with  . Lymphadenopathy    HPI TRESIA REVOLORIO is a 51 y.o. female.   Patient developed soreness in her left cervical nodes about five days ago, and swelling behind her left ear.  She developed vague frontal headache, and two days ago developed a painful rash on her left forehead.  No sore throat or URI symptoms.  No fevers, chills, and sweats.   The history is provided by the patient.    Past Medical History:  Diagnosis Date  . Bulimia   . Hyperlipidemia   . Hypokalemia   . Hypothyroidism   . Uterine polyp     Patient Active Problem List   Diagnosis Date Noted  . Hyperlipidemia 06/15/2015  . Hypokalemia 06/13/2014  . Hypothyroidism 04/10/2011  . Bulimia 04/10/2011    Past Surgical History:  Procedure Laterality Date  . BREAST BIOPSY    . BUNIONECTOMY  1994   right foot   . ENDOMETRIAL ABLATION  2009   per Dr. Louretta Shorten   . LIPOMA EXCISION     from right axilla   . MANDIBLE RECONSTRUCTION  1994    OB History    No data available       Home Medications    Prior to Admission medications   Medication Sig Start Date End Date Taking? Authorizing Provider  Estradiol (MINIVELLE TD) Place onto the skin.   Yes [provider]  Progesterone Micronized (PROGESTERONE PO) Take by mouth.   Yes [provider]  escitalopram (LEXAPRO) 10 MG tablet Take 1 tablet (10 mg total) by mouth daily. 07/01/16   Laurey Morale, MD  KLOR-CON M20 20 MEQ tablet TAKE 1 TABLET BY MOUTH DAILY. 08/27/16   Laurey Morale, MD  SYNTHROID 100 MCG tablet TAKE 1 TABLET BY MOUTH DAILY. 07/16/17   Laurey Morale, MD  valACYclovir (VALTREX) 1000 MG tablet Take 1 tablet (1,000 mg total) by mouth 3 (three) times daily. Take every 8 hours. 07/27/17   Kandra Nicolas, MD    Family History Family History  Problem Relation Age of Onset    . Breast cancer Maternal Grandmother   . Hyperlipidemia Maternal Grandfather   . Diabetes Maternal Grandfather   . Breast cancer Maternal Aunt   . Breast cancer Other     Social History Social History  Substance Use Topics  . Smoking status: Never Smoker  . Smokeless tobacco: Never Used  . Alcohol use 1.2 oz/week    2 Standard drinks or equivalent per week     Comment: socially     Allergies   Patient has no known allergies.   Review of Systems Review of Systems No sore throat No cough No pleuritic pain No wheezing No nasal congestion, but has facial pressure. No post-nasal drainage ? sinus pain/pressure No itchy/red eyes ? earache No hemoptysis No SOB No fever/chills No nausea No vomiting No abdominal pain No diarrhea No urinary symptoms No skin rash + fatigue No myalgias + headache Used OTC meds without relief   Physical Exam Triage Vital Signs ED Triage Vitals  Enc Vitals Group     BP 07/27/17 1222 106/68     Pulse Rate 07/27/17 1222 70     Resp --      Temp 07/27/17 1222 98.1 F (36.7 C)     Temp Source  07/27/17 1222 Oral     SpO2 07/27/17 1222 100 %     Weight 07/27/17 1223 127 lb (57.6 kg)     Height 07/27/17 1223 5' (1.524 m)     Head Circumference --      Peak Flow --      Pain Score 07/27/17 1225 2     Pain Loc --      Pain Edu? --      Excl. in [place]? --    No data found.   Updated Vital Signs BP 106/68 (BP Location: Left Arm)   Pulse 70   Temp 98.1 F (36.7 C) (Oral)   Ht 5' (1.524 m)   Wt 127 lb (57.6 kg)   SpO2 100%   BMI 24.80 kg/m   Visual Acuity Right Eye Distance:   Left Eye Distance:   Bilateral Distance:    Right Eye Near:   Left Eye Near:    Bilateral Near:     Physical Exam  Constitutional: She appears well-developed and well-nourished. No distress.  HENT:  Head: Head is without right periorbital erythema and without left periorbital erythema.    Right Ear: Tympanic membrane, external ear and ear canal  normal.  Left Ear: Tympanic membrane, external ear and ear canal normal.  Nose: Nose normal.  Mouth/Throat: Oropharynx is clear and moist.  Left forehead reveals mildly erythematous herpetiform eruption without vesicles, slightly tender to palpation.  No induration or fluctuance.  Eyes: Pupils are equal, round, and reactive to light. Conjunctivae and EOM are normal. Right eye exhibits no discharge. Left eye exhibits no discharge.  Cardiovascular: Normal heart sounds.   Pulmonary/Chest: Breath sounds normal.  Abdominal: There is no tenderness.  Lymphadenopathy:    She has cervical adenopathy.  Neurological: She is alert.  Skin: Skin is warm and dry.  Nursing note and vitals reviewed.    UC Treatments / Results  Labs (all labs ordered are listed, but only abnormal results are displayed) Labs Reviewed - No data to display  EKG  EKG Interpretation None       Radiology No results found.  Procedures Procedures (including critical care time)  Medications Ordered in UC Medications - No data to display   Initial Impression / Assessment and Plan / UC Course  I have reviewed the triage vital signs and the nursing notes.  Pertinent labs & imaging results that were available during my care of the patient were reviewed by me and considered in my medical decision making (see chart for details).    Begin Valtrex. May take Ibuprofen 200mg , 4 tabs every 8 hours with food, if needed. Followup with Family Doctor if not improving.    Final Clinical Impressions(s) / UC Diagnoses   Final diagnoses:  Herpes zoster without complication    New Prescriptions New Prescriptions   VALACYCLOVIR (VALTREX) 1000 MG TABLET    Take 1 tablet (1,000 mg total) by mouth 3 (three) times daily. Take every 8 hours.         Kandra Nicolas, MD 08/02/17 [phone]

## 2017-07-27 NOTE — ED Triage Notes (Signed)
Pt c/o swollen near left ear, rash on forehead, sinus pressure, head pressure possible infection, itching.

## 2017-07-27 NOTE — Discharge Instructions (Signed)
May take Ibuprofen 200mg , 4 tabs every 8 hours with food, if needed.

## 2017-07-31 ENCOUNTER — Encounter: Payer: Self-pay | Admitting: Family Medicine

## 2017-08-08 ENCOUNTER — Ambulatory Visit (INDEPENDENT_AMBULATORY_CARE_PROVIDER_SITE_OTHER): Payer: 59 | Admitting: Family Medicine

## 2017-08-08 ENCOUNTER — Encounter: Payer: Self-pay | Admitting: Family Medicine

## 2017-08-08 VITALS — BP 98/62 | Temp 98.0°F | Ht 60.0 in | Wt 125.0 lb

## 2017-08-08 DIAGNOSIS — Z23 Encounter for immunization: Secondary | ICD-10-CM | POA: Diagnosis not present

## 2017-08-08 DIAGNOSIS — Z0000 Encounter for general adult medical examination without abnormal findings: Secondary | ICD-10-CM

## 2017-08-08 DIAGNOSIS — R5383 Other fatigue: Secondary | ICD-10-CM | POA: Diagnosis not present

## 2017-08-08 LAB — CBC WITH DIFFERENTIAL/PLATELET
BASOS ABS: 0.1 10*3/uL (ref 0.0–0.1)
BASOS PCT: 1.4 % (ref 0.0–3.0)
EOS ABS: 0.1 10*3/uL (ref 0.0–0.7)
Eosinophils Relative: 1.7 % (ref 0.0–5.0)
HEMATOCRIT: 42.6 % (ref 36.0–46.0)
Hemoglobin: 14.5 g/dL (ref 12.0–15.0)
LYMPHS PCT: 38.1 % (ref 12.0–46.0)
Lymphs Abs: 1.7 10*3/uL (ref 0.7–4.0)
MCHC: 34 g/dL (ref 30.0–36.0)
MCV: 93.2 fl (ref 78.0–100.0)
MONO ABS: 0.3 10*3/uL (ref 0.1–1.0)
Monocytes Relative: 7.7 % (ref 3.0–12.0)
NEUTROS ABS: 2.2 10*3/uL (ref 1.4–7.7)
Neutrophils Relative %: 51.1 % (ref 43.0–77.0)
PLATELETS: 256 10*3/uL (ref 150.0–400.0)
RBC: 4.57 Mil/uL (ref 3.87–5.11)
RDW: 13.1 % (ref 11.5–15.5)
WBC: 4.4 10*3/uL (ref 4.0–10.5)

## 2017-08-08 LAB — POC URINALSYSI DIPSTICK (AUTOMATED)
BILIRUBIN UA: NEGATIVE
Clarity, UA: NEGATIVE
GLUCOSE UA: NEGATIVE
Leukocytes, UA: NEGATIVE
NITRITE UA: NEGATIVE
PH UA: 8 (ref 5.0–8.0)
Protein, UA: NEGATIVE
RBC UA: NEGATIVE
SPEC GRAV UA: 1.01 (ref 1.010–1.025)
UROBILINOGEN UA: 0.2 U/dL

## 2017-08-08 LAB — LIPID PANEL
CHOL/HDL RATIO: 3
CHOLESTEROL: 215 mg/dL — AB (ref 0–200)
HDL: 80.3 mg/dL (ref >39.00)
LDL Cholesterol: 120 mg/dL — ABNORMAL HIGH (ref 0–99)
NonHDL: 134.6
TRIGLYCERIDES: 73 mg/dL (ref 0.0–149.0)
VLDL: 14.6 mg/dL (ref 0.0–40.0)

## 2017-08-08 LAB — BASIC METABOLIC PANEL
BUN: 13 mg/dL (ref 6–23)
CHLORIDE: 97 meq/L (ref 96–112)
CO2: 28 meq/L (ref 19–32)
CREATININE: 0.79 mg/dL (ref 0.40–1.20)
Calcium: 10 mg/dL (ref 8.4–10.5)
GFR: 81.7 mL/min (ref >60.00)
GLUCOSE: 80 mg/dL (ref 70–99)
POTASSIUM: 3.9 meq/L (ref 3.5–5.1)
Sodium: 135 mEq/L (ref 135–145)

## 2017-08-08 LAB — HEPATIC FUNCTION PANEL
ALBUMIN: 4.7 g/dL (ref 3.5–5.2)
ALT: 15 U/L (ref 0–35)
AST: 17 U/L (ref 0–37)
Alkaline Phosphatase: 56 U/L (ref 39–117)
Bilirubin, Direct: 0.1 mg/dL (ref 0.0–0.3)
TOTAL PROTEIN: 7.2 g/dL (ref 6.0–8.3)
Total Bilirubin: 0.8 mg/dL (ref 0.2–1.2)

## 2017-08-08 LAB — TSH: TSH: 0.67 u[IU]/mL (ref 0.35–4.50)

## 2017-08-08 LAB — VITAMIN B12: VITAMIN B 12: 1080 pg/mL — AB (ref 211–911)

## 2017-08-08 LAB — VITAMIN D 25 HYDROXY (VIT D DEFICIENCY, FRACTURES): VITD: 30.01 ng/mL (ref 30.00–100.00)

## 2017-08-08 NOTE — Patient Instructions (Signed)
WE NOW OFFER   [facility] FAST TRACK!!!  SAME DAY Appointments for ACUTE CARE  Such as: Sprains, Injuries, cuts, abrasions, rashes, muscle pain, joint pain, back pain Colds, flu, sore throats, headache, allergies, cough, fever  Ear pain, sinus and eye infections Abdominal pain, nausea, vomiting, diarrhea, upset stomach Animal/insect bites  3 Easy Ways to Schedule: Walk-In Scheduling Call in scheduling Mychart Sign-up: [email or web address]         

## 2017-08-10 NOTE — Progress Notes (Signed)
   Subjective:    Patient ID: Tamara Brewer, female    DOB: [birth year], 51 y.o.   MRN: [id number]  HPI Here for a well exam. She feels great.    Review of Systems  Constitutional: Negative.   HENT: Negative.   Eyes: Negative.   Respiratory: Negative.   Cardiovascular: Negative.   Gastrointestinal: Negative.   Genitourinary: Negative for decreased urine volume, difficulty urinating, dyspareunia, dysuria, enuresis, flank pain, frequency, hematuria, pelvic pain and urgency.  Musculoskeletal: Negative.   Skin: Negative.   Neurological: Negative.   Psychiatric/Behavioral: Negative.        Objective:   Physical Exam  Constitutional: She is oriented to person, place, and time. She appears well-developed and well-nourished. No distress.  HENT:  Head: Normocephalic and atraumatic.  Right Ear: External ear normal.  Left Ear: External ear normal.  Nose: Nose normal.  Mouth/Throat: Oropharynx is clear and moist. No oropharyngeal exudate.  Eyes: Pupils are equal, round, and reactive to light. Conjunctivae and EOM are normal. No scleral icterus.  Neck: Normal range of motion. Neck supple. No JVD present. No thyromegaly present.  Cardiovascular: Normal rate, regular rhythm, normal heart sounds and intact distal pulses.  Exam reveals no gallop and no friction rub.   No murmur heard. Pulmonary/Chest: Effort normal and breath sounds normal. No respiratory distress. She has no wheezes. She has no rales. She exhibits no tenderness.  Abdominal: Soft. Bowel sounds are normal. She exhibits no distension and no mass. There is no tenderness. There is no rebound and no guarding.  Musculoskeletal: Normal range of motion. She exhibits no edema or tenderness.  Lymphadenopathy:    She has no cervical adenopathy.  Neurological: She is alert and oriented to person, place, and time. She has normal reflexes. No cranial nerve deficit. She exhibits normal muscle tone. Coordination normal.  Skin: Skin is warm  and dry. No rash noted. No erythema.  Psychiatric: She has a normal mood and affect. Her behavior is normal. Judgment and thought content normal.          Assessment & Plan:  Well exam. We discussed diet and exercise. Get fasting labs.  Alysia Penna, MD

## 2017-08-12 ENCOUNTER — Other Ambulatory Visit: Payer: Self-pay | Admitting: Family Medicine

## 2017-08-12 MED FILL — MINIVELLE 0.1 MG PATCH: 0.1 | 28 days supply | Qty: 8 | Fill #0

## 2017-08-12 MED FILL — ESCITALOPRAM 10 MG TABLET: 10 | 90 days supply | Qty: 90 | Fill #0

## 2017-08-12 MED FILL — PROGESTERONE 100 MG CAPSULE: 100 | 30 days supply | Qty: 30 | Fill #0

## 2017-08-14 ENCOUNTER — Encounter: Payer: Self-pay | Admitting: Gastroenterology

## 2017-09-17 ENCOUNTER — Other Ambulatory Visit: Payer: Self-pay | Admitting: Family Medicine

## 2017-09-17 MED FILL — MINIVELLE 0.1 MG PATCH: 0.1 | 28 days supply | Qty: 8 | Fill #0

## 2017-09-17 MED FILL — PROGESTERONE 100 MG CAPSULE: 100 | 30 days supply | Qty: 30 | Fill #0

## 2017-09-18 MED FILL — POTASSIUM CL ER 20 MEQ TABL: 20 | 90 days supply | Qty: 90 | Fill #0

## 2017-09-26 ENCOUNTER — Other Ambulatory Visit: Payer: Self-pay

## 2017-09-26 ENCOUNTER — Ambulatory Visit (AMBULATORY_SURGERY_CENTER): Payer: Self-pay | Admitting: *Deleted

## 2017-09-26 VITALS — Ht 63.75 in | Wt 127.4 lb

## 2017-09-26 DIAGNOSIS — Z1211 Encounter for screening for malignant neoplasm of colon: Secondary | ICD-10-CM

## 2017-09-26 MED ORDER — NA SULFATE-K SULFATE-MG SULF 17.5-3.13-1.6 GM/177ML PO SOLN: 1.0000 | Freq: Once | ORAL | 0 refills | Status: AC

## 2017-09-26 MED FILL — SUPREP BOWEL PREP KIT: 17.5-3.13-1 | 1 days supply | Qty: 354 | Fill #0

## 2017-09-26 NOTE — Progress Notes (Signed)
No egg or soy allergy known to patient  No issues with past sedation with any surgeries  or procedures, no intubation problems  No diet pills per patient No home 02 use per patient  No blood thinners per patient  Pt denies issues with constipation  No A fib or A flutter  EMMI video sent to pt's e mail  

## 2017-10-01 ENCOUNTER — Encounter: Payer: Self-pay | Admitting: Gastroenterology

## 2017-10-07 DIAGNOSIS — Z01419 Encounter for gynecological examination (general) (routine) without abnormal findings: Secondary | ICD-10-CM | POA: Diagnosis not present

## 2017-10-07 DIAGNOSIS — Z6822 Body mass index (BMI) 22.0-22.9, adult: Secondary | ICD-10-CM | POA: Diagnosis not present

## 2017-10-07 MED FILL — CLOBETASOL PROPIONATE 0.05: 0.05 | 7 days supply | Qty: 30 | Fill #0

## 2017-10-09 MED FILL — ESTRADIOL 0.1 MG/24HR PTTW: 0.1 | 28 days supply | Qty: 8 | Fill #0

## 2017-10-13 ENCOUNTER — Ambulatory Visit (AMBULATORY_SURGERY_CENTER): Payer: 59 | Admitting: Gastroenterology

## 2017-10-13 ENCOUNTER — Encounter: Payer: Self-pay | Admitting: Gastroenterology

## 2017-10-13 ENCOUNTER — Other Ambulatory Visit: Payer: Self-pay | Admitting: Family Medicine

## 2017-10-13 VITALS — BP 101/60 | HR 52 | Temp 97.1°F | Resp 13 | Ht 63.75 in | Wt 127.0 lb

## 2017-10-13 DIAGNOSIS — Z1211 Encounter for screening for malignant neoplasm of colon: Secondary | ICD-10-CM

## 2017-10-13 DIAGNOSIS — D128 Benign neoplasm of rectum: Secondary | ICD-10-CM

## 2017-10-13 DIAGNOSIS — K621 Rectal polyp: Secondary | ICD-10-CM

## 2017-10-13 HISTORY — PX: COLONOSCOPY: SHX174

## 2017-10-13 MED ORDER — SODIUM CHLORIDE 0.9 % IV SOLN: 500.0000 mL | INTRAVENOUS | Status: DC

## 2017-10-13 MED FILL — PROGESTERONE 100 MG CAPSULE: 100 | 90 days supply | Qty: 90 | Fill #0

## 2017-10-13 NOTE — Op Note (Signed)
[facility] Patient Name: Sira Adsit Procedure Date: 10/13/2017 9:24 AM MRN: [id number] Endoscopist: Mauri Pole , MD Age: 51 Referring MD:  Date of Birth: 1966-05-07 Gender: Female Account #: [id number] Procedure:                Colonoscopy Indications:              Screening for colorectal malignant neoplasm, This                            is the patient's first colonoscopy Medicines:                Monitored Anesthesia Care Procedure:                Pre-Anesthesia Assessment:                           - Prior to the procedure, a History and Physical                            was performed, and patient medications and                            allergies were reviewed. The patient's tolerance of                            previous anesthesia was also reviewed. The risks                            and benefits of the procedure and the sedation                            options and risks were discussed with the patient.                            All questions were answered, and informed consent                            was obtained. Prior Anticoagulants: The patient has                            taken no previous anticoagulant or antiplatelet                            agents. ASA Grade Assessment: I - A normal, healthy                            patient. After reviewing the risks and benefits,                            the patient was deemed in satisfactory condition to                            undergo the procedure.  After obtaining informed consent, the colonoscope                            was passed under direct vision. Throughout the                            procedure, the patient's blood pressure, pulse, and                            oxygen saturations were monitored continuously. The                            Colonoscope was introduced through the anus and                            advanced to the the terminal ileum,  with                            identification of the appendiceal orifice and IC                            valve. The colonoscopy was performed without                            difficulty. The patient tolerated the procedure                            well. The quality of the bowel preparation was                            excellent. The terminal ileum, ileocecal valve,                            appendiceal orifice, and rectum were photographed. Scope In: 9:34:09 AM Scope Out: 9:52:02 AM Scope Withdrawal Time: 0 hours 12 minutes 16 seconds  Total Procedure Duration: 0 hours 17 minutes 53 seconds  Findings:                 The perianal and digital rectal examinations were                            normal.                           A 2 mm polyp was found in the rectum. The polyp was                            flat. The polyp was removed with a cold biopsy                            forceps. Resection and retrieval were complete.                           Non-bleeding internal hemorrhoids were found during  retroflexion. The hemorrhoids were small.                           The exam was otherwise without abnormality. Complications:            No immediate complications. Estimated Blood Loss:     Estimated blood loss was minimal. Impression:               - One 2 mm polyp in the rectum, removed with a cold                            biopsy forceps. Resected and retrieved.                           - Non-bleeding internal hemorrhoids.                           - The examination was otherwise normal. Recommendation:           - Patient has a contact number available for                            emergencies. The signs and symptoms of potential                            delayed complications were discussed with the                            patient. Return to normal activities tomorrow.                            Written discharge instructions were provided to  the                            patient.                           - Resume previous diet.                           - Continue present medications.                           - Await pathology results.                           - Repeat colonoscopy in 5-10 years for surveillance                            based on pathology results. Mauri Pole, MD 10/13/2017 9:58:08 AM This report has been signed electronically.

## 2017-10-13 NOTE — Progress Notes (Signed)
Called to room to assist during endoscopic procedure.  Patient ID and intended procedure confirmed with present staff. Received instructions for my participation in the procedure from the performing physician.  

## 2017-10-13 NOTE — Patient Instructions (Signed)
YOU HAD AN ENDOSCOPIC PROCEDURE TODAY AT [facility]:   Refer to the procedure report that was given to you for any specific questions about what was found during the examination.  If the procedure report does not answer your questions, please call your gastroenterologist to clarify.  If you requested that your care partner not be given the details of your procedure findings, then the procedure report has been included in a sealed envelope for you to review at your convenience later.  YOU SHOULD EXPECT: Some feelings of bloating in the abdomen. Passage of more gas than usual.  Walking can help get rid of the air that was put into your GI tract during the procedure and reduce the bloating. If you had a lower endoscopy (such as a colonoscopy or flexible sigmoidoscopy) you may notice spotting of blood in your stool or on the toilet paper. If you underwent a bowel prep for your procedure, you may not have a normal bowel movement for a few days.  Please Note:  You might notice some irritation and congestion in your nose or some drainage.  This is from the oxygen used during your procedure.  There is no need for concern and it should clear up in a day or so.  SYMPTOMS TO REPORT IMMEDIATELY:   Following lower endoscopy (colonoscopy or flexible sigmoidoscopy):  Excessive amounts of blood in the stool  Significant tenderness or worsening of abdominal pains  Swelling of the abdomen that is new, acute  Fever of 100F or higher  Please see handouts given to you by your recovery nurse.  For urgent or emergent issues, a gastroenterologist can be reached at any hour by calling [phone].   DIET:  We do recommend a small meal at first, but then you may proceed to your regular diet.  Drink plenty of fluids but you should avoid alcoholic beverages for 24 hours.  ACTIVITY:  You should plan to take it easy for the rest of today and you should NOT DRIVE or use heavy machinery until tomorrow  (because of the sedation medicines used during the test).    FOLLOW UP: Our staff will call the number listed on your records the next business day following your procedure to check on you and address any questions or concerns that you may have regarding the information given to you following your procedure. If we do not reach you, we will leave a message.  However, if you are feeling well and you are not experiencing any problems, there is no need to return our call.  We will assume that you have returned to your regular daily activities without incident.  If any biopsies were taken you will be contacted by phone or by letter within the next 1-3 weeks.  Please call us at [phone] if you have not heard about the biopsies in 3 weeks.    SIGNATURES/CONFIDENTIALITY: You and/or your care partner have signed paperwork which will be entered into your electronic medical record.  These signatures attest to the fact that that the information above on your After Visit Summary has been reviewed and is understood.  Full responsibility of the confidentiality of this discharge information lies with you and/or your care-partner.  Thank you for letting us take care of your healthcare needs today.

## 2017-10-13 NOTE — Progress Notes (Signed)
A and O x3. Report to RN. Tolerated MAC anesthesia well.

## 2017-10-14 ENCOUNTER — Telehealth: Payer: Self-pay | Admitting: *Deleted

## 2017-10-14 MED FILL — SYNTHROID 100 MCG TABLET: 100 | 90 days supply | Qty: 90 | Fill #0

## 2017-10-14 NOTE — Telephone Encounter (Signed)
  Follow up Call-  Call back number 10/13/2017  Post procedure Call Back phone  # [phone]  Permission to leave phone message Yes  Some recent data might be hidden     Patient questions:  Do you have a fever, pain , or abdominal swelling? No. Pain Score  0 *  Have you tolerated food without any problems? Yes.    Have you been able to return to your normal activities? Yes.    Do you have any questions about your discharge instructions: Diet   No. Medications  No. Follow up visit  No.  Do you have questions or concerns about your Care? No.  Actions: * If pain score is 4 or above: No action needed, pain <4.

## 2017-10-17 ENCOUNTER — Encounter: Payer: Self-pay | Admitting: Gastroenterology

## 2017-11-17 MED FILL — ESTRADIOL 0.1 MG/24HR PTTW: 0.1 | 28 days supply | Qty: 8 | Fill #1

## 2017-11-17 MED FILL — ESCITALOPRAM 10 MG TABLET: 10 | 90 days supply | Qty: 90 | Fill #1

## 2017-11-26 DIAGNOSIS — H5213 Myopia, bilateral: Secondary | ICD-10-CM | POA: Diagnosis not present

## 2017-11-26 DIAGNOSIS — H524 Presbyopia: Secondary | ICD-10-CM | POA: Diagnosis not present

## 2017-12-25 ENCOUNTER — Other Ambulatory Visit: Payer: Self-pay | Admitting: Family Medicine

## 2017-12-25 MED FILL — ESTRADIOL 0.1 MG/24HR PTTW: 0.1 | 28 days supply | Qty: 8 | Fill #2

## 2018-01-14 MED FILL — SYNTHROID 100 MCG TABLET: 100 | 90 days supply | Qty: 90 | Fill #0

## 2018-01-14 MED FILL — POTASSIUM CL ER 20 MEQ TABL: 20 | 90 days supply | Qty: 90 | Fill #1

## 2018-01-22 MED FILL — ESTRADIOL 0.1 MG/24HR PTTW: 0.1 | 28 days supply | Qty: 8 | Fill #3

## 2018-02-05 MED FILL — PROGESTERONE 100 MG CAPSULE: 100 | 90 days supply | Qty: 90 | Fill #1

## 2018-02-25 MED FILL — ESTRADIOL 0.1 MG/24HR PTTW: 0.1 | 28 days supply | Qty: 8 | Fill #4

## 2018-02-25 MED FILL — ESCITALOPRAM 10 MG TABLET: 10 | 90 days supply | Qty: 90 | Fill #2

## 2018-03-12 ENCOUNTER — Encounter: Payer: Self-pay | Admitting: Family Medicine

## 2018-03-12 ENCOUNTER — Ambulatory Visit: Payer: 59 | Admitting: Family Medicine

## 2018-03-12 VITALS — BP 118/62 | HR 97 | Temp 98.1°F | Ht 63.75 in | Wt 130.0 lb

## 2018-03-12 DIAGNOSIS — J069 Acute upper respiratory infection, unspecified: Secondary | ICD-10-CM

## 2018-03-12 DIAGNOSIS — J019 Acute sinusitis, unspecified: Secondary | ICD-10-CM | POA: Diagnosis not present

## 2018-03-12 MED ORDER — METHYLPREDNISOLONE ACETATE 80 MG/ML IJ SUSP
120.0000 mg | Freq: Once | INTRAMUSCULAR | Status: AC
  Administered 2018-03-12: 120 mg via INTRAMUSCULAR

## 2018-03-13 NOTE — Progress Notes (Signed)
   Subjective:    Patient ID: Tamara Brewer, female    DOB: [birth year], 52 y.o.   MRN: [id number]  HPI Here for 4 days of sinus pressure, headache, PND, and a dry cough. On Mucinex D.    Review of Systems  Constitutional: Negative.   HENT: Positive for congestion, postnasal drip, sinus pressure and sinus pain. Negative for sore throat.   Eyes: Negative.   Respiratory: Positive for cough.        Objective:   Physical Exam  Constitutional: She appears well-developed and well-nourished.  HENT:  Right Ear: External ear normal.  Left Ear: External ear normal.  Nose: Nose normal.  Mouth/Throat: Oropharynx is clear and moist.  Eyes: Conjunctivae are normal.  Neck: No thyromegaly present.  Pulmonary/Chest: Effort normal and breath sounds normal. No stridor. No respiratory distress. She has no wheezes. She has no rales.  Lymphadenopathy:    She has no cervical adenopathy.          Assessment & Plan:  Sinusitis, given a steroid shot and Augmentin.  Alysia Penna, MD

## 2018-03-23 MED FILL — ESTRADIOL 0.1 MG/24HR PTTW: 0.1 | 28 days supply | Qty: 8 | Fill #5

## 2018-04-17 ENCOUNTER — Other Ambulatory Visit: Payer: Self-pay | Admitting: Family Medicine

## 2018-04-17 MED FILL — SYNTHROID 100 MCG TABLET: 100 | 30 days supply | Qty: 30 | Fill #0

## 2018-04-17 MED FILL — POTASSIUM CL ER 20 MEQ TAB: 20 | 90 days supply | Qty: 90 | Fill #2

## 2018-04-27 MED FILL — ESTRADIOL 0.1 MG/24HR PTTW: 0.1 | 28 days supply | Qty: 8 | Fill #6

## 2018-05-08 MED FILL — PROGESTERONE 100 MG CAPSULE: 100 | 90 days supply | Qty: 90 | Fill #2

## 2018-05-25 ENCOUNTER — Other Ambulatory Visit: Payer: Self-pay | Admitting: *Deleted

## 2018-05-25 ENCOUNTER — Telehealth: Payer: Self-pay | Admitting: Family Medicine

## 2018-05-25 MED ORDER — SYNTHROID 100 MCG PO TABS: 100.0000 ug | ORAL_TABLET | Freq: Every day | ORAL | 0 refills | Status: DC

## 2018-05-25 MED FILL — SYNTHROID 100 MCG TABLET: 100 | 30 days supply | Qty: 30 | Fill #0

## 2018-05-25 NOTE — Telephone Encounter (Signed)
Call to patient- she states she was only given #30 and she did not have her dose this morning- patient has appointment scheduled 06/05/18. #30 sent to pharmacy 0 RF.

## 2018-05-25 NOTE — Telephone Encounter (Signed)
Rx #90 sent to pharmacy in June( 04/17/18) patient should have enough to get her through appointment.

## 2018-05-25 NOTE — Telephone Encounter (Signed)
Copied from [place] [id number]. Topic: Quick Communication - Rx Refill/Question >> May 25, 2018  9:33 AM Cecelia Byars, NT wrote: Medication:  SYNTHROID 100 MCG tablet   Has the patient contacted their pharmacy? yes (Agent: If no, request that the patient contact the pharmacy for the refill. (Agent: If yes, when and what did the pharmacy advise?)  Preferred Pharmacy (with phone number or street name [place], [place] - [address]. [phone] (Phone) [phone] (Fax)  Patient has an appointment for 05/26/18 at 200 PM scheduled     Agent: Please be advised that RX refills may take up to 3 business days. We ask that you follow-up with your pharmacy.

## 2018-06-03 MED FILL — ESTRADIOL 0.1 MG/24HR PTTW: 0.1 | 28 days supply | Qty: 8 | Fill #7

## 2018-06-03 MED FILL — ESCITALOPRAM 10 MG TABLET: 10 | 90 days supply | Qty: 90 | Fill #3

## 2018-06-05 ENCOUNTER — Ambulatory Visit: Payer: 59 | Admitting: Family Medicine

## 2018-06-05 ENCOUNTER — Encounter: Payer: Self-pay | Admitting: Family Medicine

## 2018-06-05 VITALS — BP 112/70 | HR 75 | Temp 98.0°F | Ht 63.75 in | Wt 130.6 lb

## 2018-06-05 DIAGNOSIS — E039 Hypothyroidism, unspecified: Secondary | ICD-10-CM | POA: Diagnosis not present

## 2018-06-05 LAB — T4, FREE: FREE T4: 0.77 ng/dL (ref 0.60–1.60)

## 2018-06-05 LAB — T3, FREE: T3, Free: 3.1 pg/mL (ref 2.3–4.2)

## 2018-06-05 LAB — TSH: TSH: 1.03 u[IU]/mL (ref 0.35–4.50)

## 2018-06-05 NOTE — Progress Notes (Signed)
   Subjective:    Patient ID: Tamara Brewer, female    DOB: [birth year], 52 y.o.   MRN: [id number]  HPI Here to follow up on her thyroid levels. She feels great. Her weight is steady.    Review of Systems  Constitutional: Negative.   Respiratory: Negative.   Cardiovascular: Negative.   Endocrine: Negative.   Neurological: Negative.        Objective:   Physical Exam  Constitutional: She is oriented to person, place, and time. She appears well-developed and well-nourished.  Neck: No thyromegaly present.  Cardiovascular: Normal rate, regular rhythm, normal heart sounds and intact distal pulses.  Pulmonary/Chest: Effort normal and breath sounds normal.  Lymphadenopathy:    She has no cervical adenopathy.  Neurological: She is alert and oriented to person, place, and time.          Assessment & Plan:  Hypothyroidism. Check a thyroid panel.  Alysia Penna, MD

## 2018-06-24 MED FILL — SYNTHROID 100 MCG TABLET: 100 | 30 days supply | Qty: 30 | Fill #1

## 2018-07-14 MED FILL — ESTRADIOL 0.1 MG/24HR PTTW: 0.1 | 28 days supply | Qty: 8 | Fill #8

## 2018-07-23 MED FILL — POTASSIUM CL ER 20 MEQ TAB: 20 | 90 days supply | Qty: 90 | Fill #3

## 2018-07-23 MED FILL — SYNTHROID 100 MCG TABLET: 100 | 30 days supply | Qty: 30 | Fill #2

## 2018-07-28 ENCOUNTER — Other Ambulatory Visit: Payer: Self-pay | Admitting: Obstetrics & Gynecology

## 2018-07-28 DIAGNOSIS — Z1231 Encounter for screening mammogram for malignant neoplasm of breast: Secondary | ICD-10-CM

## 2018-08-12 MED FILL — PROGESTERONE 100 MG CAPSULE: 100 | 90 days supply | Qty: 90 | Fill #3

## 2018-08-12 MED FILL — ESTRADIOL 0.1 MG/24HR PTTW: 0.1 | 28 days supply | Qty: 8 | Fill #9

## 2018-08-25 ENCOUNTER — Other Ambulatory Visit: Payer: Self-pay | Admitting: Family Medicine

## 2018-08-25 MED FILL — SYNTHROID 100 MCG TABLET: 100 | 90 days supply | Qty: 90 | Fill #0

## 2018-08-28 ENCOUNTER — Ambulatory Visit
Admission: RE | Admit: 2018-08-28 | Discharge: 2018-08-28 | Disposition: A | Discharge status: Home / Self Care | Payer: 59 | Source: Ambulatory Visit | Attending: Obstetrics & Gynecology | Admitting: Obstetrics & Gynecology

## 2018-08-28 DIAGNOSIS — Z1231 Encounter for screening mammogram for malignant neoplasm of breast: Secondary | ICD-10-CM

## 2018-09-10 ENCOUNTER — Other Ambulatory Visit: Payer: Self-pay | Admitting: Family Medicine

## 2018-09-10 MED FILL — ESTRADIOL 0.1 MG/24HR PTTW: 0.1 | 28 days supply | Qty: 8 | Fill #10

## 2018-09-14 MED FILL — ESCITALOPRAM 10 MG TABLET: 10 | 90 days supply | Qty: 90 | Fill #0

## 2018-09-14 NOTE — Telephone Encounter (Signed)
Copied from [place] [id number]. Topic: Quick Communication - Rx Refill/Question >> Sep 14, 2018  3:11 PM Bea Graff, NT wrote: Medication: escitalopram (LEXAPRO) 10 MG tablet   Has the patient contacted their pharmacy? Yes.   (Agent: If no, request that the patient contact the pharmacy for the refill.) (Agent: If yes, when and what did the pharmacy advise?)  Preferred Pharmacy (with phone number or street name): [place], [place] - [address]. [phone] (Phone) [phone] (Fax)    Agent: Please be advised that RX refills may take up to 3 business days. We ask that you follow-up with your pharmacy.

## 2018-10-15 DIAGNOSIS — Z01419 Encounter for gynecological examination (general) (routine) without abnormal findings: Secondary | ICD-10-CM | POA: Diagnosis not present

## 2018-10-15 DIAGNOSIS — Z6823 Body mass index (BMI) 23.0-23.9, adult: Secondary | ICD-10-CM | POA: Diagnosis not present

## 2018-10-15 MED FILL — ESTRADIOL 0.1 MG/24HR PTTW: 0.1 | 84 days supply | Qty: 24 | Fill #0

## 2018-11-06 ENCOUNTER — Other Ambulatory Visit: Payer: Self-pay | Admitting: Family Medicine

## 2018-11-06 MED FILL — POTASSIUM CHLORIDE CRYS ER: 20 | 30 days supply | Qty: 30 | Fill #0

## 2018-11-27 MED FILL — SYNTHROID 100 MCG TABLET: 100 | 90 days supply | Qty: 90 | Fill #1

## 2018-11-27 MED FILL — PROGESTERONE 100 MG CAPSULE: 100 | 90 days supply | Qty: 90 | Fill #0

## 2018-12-04 ENCOUNTER — Other Ambulatory Visit: Payer: Self-pay | Admitting: Family Medicine

## 2018-12-08 MED FILL — POTASSIUM CHLORIDE CRYS ER: 20 | 30 days supply | Qty: 30 | Fill #0

## 2018-12-09 ENCOUNTER — Other Ambulatory Visit: Payer: Self-pay | Admitting: Family Medicine

## 2018-12-09 MED FILL — ESCITALOPRAM 10 MG TABLET: 10 | 90 days supply | Qty: 90 | Fill #0

## 2018-12-25 DIAGNOSIS — H5213 Myopia, bilateral: Secondary | ICD-10-CM | POA: Diagnosis not present

## 2019-01-01 DIAGNOSIS — Z13820 Encounter for screening for osteoporosis: Secondary | ICD-10-CM | POA: Diagnosis not present

## 2019-01-11 ENCOUNTER — Other Ambulatory Visit: Payer: Self-pay | Admitting: Family Medicine

## 2019-01-13 MED FILL — POTASSIUM CHLORIDE CRYS ER: 20 | 30 days supply | Qty: 30 | Fill #0 | Status: TO

## 2019-01-25 MED FILL — ESTRADIOL 0.1 MG/24HR PTTW: 0.1 | 84 days supply | Qty: 24 | Fill #1 | Status: TO

## 2019-02-15 ENCOUNTER — Other Ambulatory Visit: Payer: Self-pay | Admitting: Family Medicine

## 2019-02-16 MED FILL — POTASSIUM CHLORIDE CRYS ER: 20 | 30 days supply | Qty: 30 | Fill #0

## 2019-02-25 ENCOUNTER — Other Ambulatory Visit: Payer: Self-pay | Admitting: Family Medicine

## 2019-02-25 MED FILL — PROGESTERONE 100 MG CAPSULE: 100 | 90 days supply | Qty: 90 | Fill #0

## 2019-03-03 ENCOUNTER — Encounter: Payer: Self-pay | Admitting: Family Medicine

## 2019-03-03 MED ORDER — SYNTHROID 100 MCG PO TABS: 100.0000 ug | ORAL_TABLET | Freq: Every day | ORAL | 0 refills | Status: DC

## 2019-03-03 MED FILL — ESCITALOPRAM 10 MG TABLET: 10 | 90 days supply | Qty: 90 | Fill #0

## 2019-03-03 MED FILL — SYNTHROID 100 MCG TABLET: 100 | 90 days supply | Qty: 90 | Fill #0

## 2019-03-17 MED FILL — POTASSIUM CHLORIDE CRYS ER: 20 | 30 days supply | Qty: 30 | Fill #1

## 2019-04-14 ENCOUNTER — Other Ambulatory Visit: Payer: Self-pay | Admitting: Family Medicine

## 2019-04-14 MED FILL — ESTRADIOL 0.1 MG/24HR PTTW: 0.1 | 84 days supply | Qty: 24 | Fill #0

## 2019-04-16 MED FILL — POTASSIUM CHLORIDE CRYS ER: 20 | 30 days supply | Qty: 30 | Fill #0

## 2019-05-11 ENCOUNTER — Encounter: Payer: Self-pay | Admitting: Family Medicine

## 2019-05-11 ENCOUNTER — Ambulatory Visit (INDEPENDENT_AMBULATORY_CARE_PROVIDER_SITE_OTHER): Payer: 59 | Admitting: Family Medicine

## 2019-05-11 ENCOUNTER — Other Ambulatory Visit: Payer: Self-pay

## 2019-05-11 VITALS — BP 102/68 | Temp 97.9°F | Ht 63.25 in | Wt 130.0 lb

## 2019-05-11 DIAGNOSIS — E039 Hypothyroidism, unspecified: Secondary | ICD-10-CM

## 2019-05-11 DIAGNOSIS — Z0000 Encounter for general adult medical examination without abnormal findings: Secondary | ICD-10-CM | POA: Diagnosis not present

## 2019-05-11 LAB — POC URINALSYSI DIPSTICK (AUTOMATED)
Bilirubin, UA: NEGATIVE
Blood, UA: NEGATIVE
Glucose, UA: NEGATIVE
Ketones, UA: NEGATIVE
Leukocytes, UA: NEGATIVE
Nitrite, UA: NEGATIVE
Protein, UA: NEGATIVE
Spec Grav, UA: <=1.005 — AB (ref 1.010–1.025)
Urobilinogen, UA: 0.2 E.U./dL
pH, UA: 8 (ref 5.0–8.0)

## 2019-05-11 LAB — CBC WITH DIFFERENTIAL/PLATELET
Basophils Absolute: 0.2 10*3/uL — ABNORMAL HIGH (ref 0.0–0.1)
Basophils Relative: 3 % (ref 0.0–3.0)
Eosinophils Absolute: 0.1 10*3/uL (ref 0.0–0.7)
Eosinophils Relative: 2.6 % (ref 0.0–5.0)
HCT: 40.5 % (ref 36.0–46.0)
Hemoglobin: 13.7 g/dL (ref 12.0–15.0)
Lymphocytes Relative: 28.6 % (ref 12.0–46.0)
Lymphs Abs: 1.5 10*3/uL (ref 0.7–4.0)
MCHC: 33.9 g/dL (ref 30.0–36.0)
MCV: 94.2 fl (ref 78.0–100.0)
Monocytes Absolute: 0.3 10*3/uL (ref 0.1–1.0)
Monocytes Relative: 6.6 % (ref 3.0–12.0)
Neutro Abs: 3.1 10*3/uL (ref 1.4–7.7)
Neutrophils Relative %: 59.2 % (ref 43.0–77.0)
Platelets: 278 10*3/uL (ref 150.0–400.0)
RBC: 4.3 Mil/uL (ref 3.87–5.11)
RDW: 13.1 % (ref 11.5–15.5)
WBC: 5.2 10*3/uL (ref 4.0–10.5)

## 2019-05-11 LAB — LIPID PANEL
Cholesterol: 228 mg/dL — ABNORMAL HIGH (ref 0–200)
HDL: 73 mg/dL (ref >39.00)
NonHDL: 154.5
Total CHOL/HDL Ratio: 3
Triglycerides: 202 mg/dL — ABNORMAL HIGH (ref 0.0–149.0)
VLDL: 40.4 mg/dL — ABNORMAL HIGH (ref 0.0–40.0)

## 2019-05-11 LAB — HEPATIC FUNCTION PANEL
ALT: 16 U/L (ref 0–35)
AST: 19 U/L (ref 0–37)
Albumin: 4.6 g/dL (ref 3.5–5.2)
Alkaline Phosphatase: 58 U/L (ref 39–117)
Bilirubin, Direct: 0.1 mg/dL (ref 0.0–0.3)
Total Bilirubin: 0.6 mg/dL (ref 0.2–1.2)
Total Protein: 7.1 g/dL (ref 6.0–8.3)

## 2019-05-11 LAB — LDL CHOLESTEROL, DIRECT: Direct LDL: 127 mg/dL

## 2019-05-11 LAB — BASIC METABOLIC PANEL
BUN: 10 mg/dL (ref 6–23)
CO2: 28 mEq/L (ref 19–32)
Calcium: 9.4 mg/dL (ref 8.4–10.5)
Chloride: 100 mEq/L (ref 96–112)
Creatinine, Ser: 0.74 mg/dL (ref 0.40–1.20)
GFR: 82.32 mL/min (ref >60.00)
Glucose, Bld: 73 mg/dL (ref 70–99)
Potassium: 4.1 mEq/L (ref 3.5–5.1)
Sodium: 137 mEq/L (ref 135–145)

## 2019-05-11 LAB — T3, FREE: T3, Free: 3.1 pg/mL (ref 2.3–4.2)

## 2019-05-11 LAB — T4, FREE: Free T4: 0.8 ng/dL (ref 0.60–1.60)

## 2019-05-11 LAB — TSH: TSH: 1.03 u[IU]/mL (ref 0.35–4.50)

## 2019-05-11 MED ORDER — ESCITALOPRAM OXALATE 10 MG PO TABS: 10.0000 mg | ORAL_TABLET | Freq: Every day | ORAL | 3 refills | Status: DC

## 2019-05-11 MED ORDER — SYNTHROID 100 MCG PO TABS: 100.0000 ug | ORAL_TABLET | Freq: Every day | ORAL | 3 refills | Status: DC

## 2019-05-11 MED ORDER — POTASSIUM CHLORIDE CRYS ER 20 MEQ PO TBCR: 20.0000 meq | EXTENDED_RELEASE_TABLET | Freq: Every day | ORAL | 3 refills | Status: DC

## 2019-05-11 MED FILL — POTASSIUM CHLORIDE CRYS ER: 20 | 90 days supply | Qty: 90 | Fill #0

## 2019-05-11 NOTE — Progress Notes (Signed)
   Subjective:    Patient ID: Tamara Brewer, female    DOB: [birth year], 53 y.o.   MRN: [id number]  HPI Here for a well exam. She feels great.    Review of Systems  Constitutional: Negative.   HENT: Negative.   Eyes: Negative.   Respiratory: Negative.   Cardiovascular: Negative.   Gastrointestinal: Negative.   Genitourinary: Negative for decreased urine volume, difficulty urinating, dyspareunia, dysuria, enuresis, flank pain, frequency, hematuria, pelvic pain and urgency.  Musculoskeletal: Negative.   Skin: Negative.   Neurological: Negative.   Psychiatric/Behavioral: Negative.        Objective:   Physical Exam Constitutional:      General: She is not in acute distress.    Appearance: She is well-developed.  HENT:     Head: Normocephalic and atraumatic.     Right Ear: External ear normal.     Left Ear: External ear normal.     Nose: Nose normal.     Mouth/Throat:     Pharynx: No oropharyngeal exudate.  Eyes:     General: No scleral icterus.    Conjunctiva/sclera: Conjunctivae normal.     Pupils: Pupils are equal, round, and reactive to light.  Neck:     Musculoskeletal: Normal range of motion and neck supple.     Thyroid: No thyromegaly.     Vascular: No JVD.  Cardiovascular:     Rate and Rhythm: Normal rate and regular rhythm.     Heart sounds: Normal heart sounds. No murmur. No friction rub. No gallop.   Pulmonary:     Effort: Pulmonary effort is normal. No respiratory distress.     Breath sounds: Normal breath sounds. No wheezing or rales.  Chest:     Chest wall: No tenderness.  Abdominal:     General: Bowel sounds are normal. There is no distension.     Palpations: Abdomen is soft. There is no mass.     Tenderness: There is no abdominal tenderness. There is no guarding or rebound.  Musculoskeletal: Normal range of motion.        General: No tenderness.  Lymphadenopathy:     Cervical: No cervical adenopathy.  Skin:    General: Skin is warm and dry.   Findings: No erythema or rash.  Neurological:     Mental Status: She is alert and oriented to person, place, and time.     Cranial Nerves: No cranial nerve deficit.     Motor: No abnormal muscle tone.     Coordination: Coordination normal.     Deep Tendon Reflexes: Reflexes are normal and symmetric. Reflexes normal.  Psychiatric:        Behavior: Behavior normal.        Thought Content: Thought content normal.        Judgment: Judgment normal.           Assessment & Plan:  Well exam. We discussed diet and exercise. Get fasting labs. Alysia Penna, MD

## 2019-05-12 ENCOUNTER — Encounter: Payer: Self-pay | Admitting: *Deleted

## 2019-05-31 MED FILL — PROGESTERONE 100 MG CAPSULE: 100 | 90 days supply | Qty: 90 | Fill #1

## 2019-05-31 MED FILL — SYNTHROID 100 MCG TABLET: 100 | 90 days supply | Qty: 90 | Fill #0

## 2019-07-15 MED FILL — ESTRADIOL 0.1 MG/24HR PTTW: 0.1 | 84 days supply | Qty: 24 | Fill #1

## 2019-07-15 MED FILL — ESCITALOPRAM 10 MG TABLET: 10 | 90 days supply | Qty: 90 | Fill #0

## 2019-08-23 ENCOUNTER — Other Ambulatory Visit: Payer: Self-pay | Admitting: Obstetrics & Gynecology

## 2019-08-23 DIAGNOSIS — Z1231 Encounter for screening mammogram for malignant neoplasm of breast: Secondary | ICD-10-CM

## 2019-08-30 MED FILL — SYNTHROID 100 MCG TABLET: 100 | 90 days supply | Qty: 90 | Fill #1

## 2019-08-30 MED FILL — POTASSIUM CHLORIDE CRYS ER: 20 | 90 days supply | Qty: 90 | Fill #1

## 2019-08-30 MED FILL — PROGESTERONE 100 MG CAPSULE: 100 | 90 days supply | Qty: 90 | Fill #2

## 2019-10-05 MED FILL — ESTRADIOL 0.1 MG/24HR PTTW: 0.1 | 84 days supply | Qty: 24 | Fill #0

## 2019-10-06 ENCOUNTER — Other Ambulatory Visit: Payer: Self-pay

## 2019-10-06 ENCOUNTER — Ambulatory Visit
Admission: RE | Admit: 2019-10-06 | Discharge: 2019-10-06 | Disposition: A | Discharge status: Home / Self Care | Payer: 59 | Source: Ambulatory Visit | Attending: Obstetrics & Gynecology | Admitting: Obstetrics & Gynecology

## 2019-10-06 DIAGNOSIS — Z1231 Encounter for screening mammogram for malignant neoplasm of breast: Secondary | ICD-10-CM | POA: Diagnosis not present

## 2019-10-11 ENCOUNTER — Other Ambulatory Visit: Payer: Self-pay | Admitting: Obstetrics & Gynecology

## 2019-10-11 DIAGNOSIS — R928 Other abnormal and inconclusive findings on diagnostic imaging of breast: Secondary | ICD-10-CM

## 2019-10-15 ENCOUNTER — Ambulatory Visit
Admission: RE | Admit: 2019-10-15 | Discharge: 2019-10-15 | Disposition: A | Discharge status: Home / Self Care | Payer: 59 | Source: Ambulatory Visit | Attending: Obstetrics & Gynecology | Admitting: Obstetrics & Gynecology

## 2019-10-15 ENCOUNTER — Other Ambulatory Visit: Payer: Self-pay

## 2019-10-15 DIAGNOSIS — N6489 Other specified disorders of breast: Secondary | ICD-10-CM | POA: Diagnosis not present

## 2019-10-15 DIAGNOSIS — R928 Other abnormal and inconclusive findings on diagnostic imaging of breast: Secondary | ICD-10-CM

## 2019-10-15 DIAGNOSIS — R922 Inconclusive mammogram: Secondary | ICD-10-CM | POA: Diagnosis not present

## 2019-10-19 DIAGNOSIS — Z79890 Hormone replacement therapy (postmenopausal): Secondary | ICD-10-CM | POA: Diagnosis not present

## 2019-10-19 DIAGNOSIS — Z01419 Encounter for gynecological examination (general) (routine) without abnormal findings: Secondary | ICD-10-CM | POA: Diagnosis not present

## 2019-10-19 DIAGNOSIS — N760 Acute vaginitis: Secondary | ICD-10-CM | POA: Diagnosis not present

## 2019-10-19 DIAGNOSIS — Z6822 Body mass index (BMI) 22.0-22.9, adult: Secondary | ICD-10-CM | POA: Diagnosis not present

## 2019-10-19 DIAGNOSIS — L900 Lichen sclerosus et atrophicus: Secondary | ICD-10-CM | POA: Diagnosis not present

## 2019-10-19 MED FILL — CLOBETASOL PROPIONATE 0.05: 0.05 | 7 days supply | Qty: 15 | Fill #0

## 2019-10-20 MED FILL — TERCONAZOLE 0.8% VAGINAL CR: 0.8 | 3 days supply | Qty: 20 | Fill #0

## 2019-10-20 MED FILL — ESCITALOPRAM 10 MG TABLET: 10 | 90 days supply | Qty: 90 | Fill #1

## 2019-12-06 MED FILL — SYNTHROID 100 MCG TABLET: 100 | 90 days supply | Qty: 90 | Fill #2

## 2019-12-06 MED FILL — POTASSIUM CHLORIDE CRYS ER: 20 | 90 days supply | Qty: 90 | Fill #2

## 2019-12-24 MED FILL — PROGESTERONE 100 MG CAPSULE: 100 | 90 days supply | Qty: 90 | Fill #0

## 2019-12-24 MED FILL — ESTRADIOL 0.1 MG/24HR PTTW: 0.1 | 84 days supply | Qty: 24 | Fill #0

## 2020-01-12 DIAGNOSIS — T7421XA Adult sexual abuse, confirmed, initial encounter: Secondary | ICD-10-CM | POA: Diagnosis not present

## 2020-01-31 MED FILL — ESCITALOPRAM 10 MG TABLET: 10 | 90 days supply | Qty: 90 | Fill #2

## 2020-02-08 DIAGNOSIS — H5213 Myopia, bilateral: Secondary | ICD-10-CM | POA: Diagnosis not present

## 2020-03-06 MED FILL — POTASSIUM CHLORIDE CRYS ER: 20 | 90 days supply | Qty: 90 | Fill #3

## 2020-03-06 MED FILL — SYNTHROID 100 MCG TABLET: 100 | 90 days supply | Qty: 90 | Fill #3

## 2020-03-28 MED FILL — ESTRADIOL 0.1 MG/24HR PTTW: 0.1 | 84 days supply | Qty: 24 | Fill #1

## 2020-03-28 MED FILL — PROGESTERONE 100 MG CAPS: 100 | 90 days supply | Qty: 90 | Fill #1

## 2020-04-11 ENCOUNTER — Other Ambulatory Visit: Payer: Self-pay

## 2020-04-12 ENCOUNTER — Encounter: Payer: Self-pay | Admitting: Family Medicine

## 2020-04-12 ENCOUNTER — Ambulatory Visit (INDEPENDENT_AMBULATORY_CARE_PROVIDER_SITE_OTHER): Payer: 59 | Admitting: Family Medicine

## 2020-04-12 ENCOUNTER — Other Ambulatory Visit: Payer: Self-pay | Admitting: Family Medicine

## 2020-04-12 VITALS — BP 110/60 | HR 68 | Temp 97.8°F | Wt 129.6 lb

## 2020-04-12 DIAGNOSIS — E039 Hypothyroidism, unspecified: Secondary | ICD-10-CM | POA: Diagnosis not present

## 2020-04-12 DIAGNOSIS — Z0000 Encounter for general adult medical examination without abnormal findings: Secondary | ICD-10-CM | POA: Diagnosis not present

## 2020-04-12 LAB — LIPID PANEL
Cholesterol: 207 mg/dL — ABNORMAL HIGH (ref 0–200)
HDL: 69.4 mg/dL (ref >39.00)
LDL Cholesterol: 108 mg/dL — ABNORMAL HIGH (ref 0–99)
NonHDL: 137.45
Total CHOL/HDL Ratio: 3
Triglycerides: 145 mg/dL (ref 0.0–149.0)
VLDL: 29 mg/dL (ref 0.0–40.0)

## 2020-04-12 LAB — CBC WITH DIFFERENTIAL/PLATELET
Basophils Absolute: 0.1 10*3/uL (ref 0.0–0.1)
Basophils Relative: 1.9 % (ref 0.0–3.0)
Eosinophils Absolute: 0.1 10*3/uL (ref 0.0–0.7)
Eosinophils Relative: 3.4 % (ref 0.0–5.0)
HCT: 38.3 % (ref 36.0–46.0)
Hemoglobin: 13.3 g/dL (ref 12.0–15.0)
Lymphocytes Relative: 34.1 % (ref 12.0–46.0)
Lymphs Abs: 1.4 10*3/uL (ref 0.7–4.0)
MCHC: 34.6 g/dL (ref 30.0–36.0)
MCV: 93 fl (ref 78.0–100.0)
Monocytes Absolute: 0.3 10*3/uL (ref 0.1–1.0)
Monocytes Relative: 7.9 % (ref 3.0–12.0)
Neutro Abs: 2.2 10*3/uL (ref 1.4–7.7)
Neutrophils Relative %: 52.7 % (ref 43.0–77.0)
Platelets: 230 10*3/uL (ref 150.0–400.0)
RBC: 4.12 Mil/uL (ref 3.87–5.11)
RDW: 13.1 % (ref 11.5–15.5)
WBC: 4.1 10*3/uL (ref 4.0–10.5)

## 2020-04-12 LAB — BASIC METABOLIC PANEL
BUN: 15 mg/dL (ref 6–23)
CO2: 31 mEq/L (ref 19–32)
Calcium: 9.4 mg/dL (ref 8.4–10.5)
Chloride: 101 mEq/L (ref 96–112)
Creatinine, Ser: 0.81 mg/dL (ref 0.40–1.20)
GFR: 73.9 mL/min (ref >60.00)
Glucose, Bld: 88 mg/dL (ref 70–99)
Potassium: 4 mEq/L (ref 3.5–5.1)
Sodium: 137 mEq/L (ref 135–145)

## 2020-04-12 LAB — HEPATIC FUNCTION PANEL
ALT: 20 U/L (ref 0–35)
AST: 21 U/L (ref 0–37)
Albumin: 4.4 g/dL (ref 3.5–5.2)
Alkaline Phosphatase: 55 U/L (ref 39–117)
Bilirubin, Direct: 0.1 mg/dL (ref 0.0–0.3)
Total Bilirubin: 0.6 mg/dL (ref 0.2–1.2)
Total Protein: 6.5 g/dL (ref 6.0–8.3)

## 2020-04-12 LAB — T4, FREE: Free T4: 0.86 ng/dL (ref 0.60–1.60)

## 2020-04-12 LAB — T3, FREE: T3, Free: 3 pg/mL (ref 2.3–4.2)

## 2020-04-12 LAB — TSH: TSH: 0.94 u[IU]/mL (ref 0.35–4.50)

## 2020-04-12 MED ORDER — POTASSIUM CHLORIDE CRYS ER 20 MEQ PO TBCR: 20.0000 meq | EXTENDED_RELEASE_TABLET | Freq: Every day | ORAL | 3 refills | Status: DC

## 2020-04-12 MED ORDER — ESCITALOPRAM OXALATE 10 MG PO TABS: 10.0000 mg | ORAL_TABLET | Freq: Every day | ORAL | 3 refills | Status: DC

## 2020-04-12 MED ORDER — SYNTHROID 100 MCG PO TABS: 100.0000 ug | ORAL_TABLET | Freq: Every day | ORAL | 3 refills | Status: DC

## 2020-04-12 NOTE — Progress Notes (Signed)
   Subjective:    Patient ID: Tamara Brewer, female    DOB: 08-15-1966, 54 y.o.   MRN: [id number]  HPI Here for a well exam. She feels fine.    Review of Systems  Constitutional: Negative.   HENT: Negative.   Eyes: Negative.   Respiratory: Negative.   Cardiovascular: Negative.   Gastrointestinal: Negative.   Genitourinary: Negative for decreased urine volume, difficulty urinating, dyspareunia, dysuria, enuresis, flank pain, frequency, hematuria, pelvic pain and urgency.  Musculoskeletal: Negative.   Skin: Negative.   Neurological: Negative.   Psychiatric/Behavioral: Negative.        Objective:   Physical Exam Constitutional:      General: She is not in acute distress.    Appearance: She is well-developed.  HENT:     Head: Normocephalic and atraumatic.     Right Ear: External ear normal.     Left Ear: External ear normal.     Nose: Nose normal.     Mouth/Throat:     Pharynx: No oropharyngeal exudate.  Eyes:     General: No scleral icterus.    Conjunctiva/sclera: Conjunctivae normal.     Pupils: Pupils are equal, round, and reactive to light.  Neck:     Thyroid: No thyromegaly.     Vascular: No JVD.  Cardiovascular:     Rate and Rhythm: Normal rate and regular rhythm.     Heart sounds: Normal heart sounds. No murmur. No friction rub. No gallop.   Pulmonary:     Effort: Pulmonary effort is normal. No respiratory distress.     Breath sounds: Normal breath sounds. No wheezing or rales.  Chest:     Chest wall: No tenderness.  Abdominal:     General: Bowel sounds are normal. There is no distension.     Palpations: Abdomen is soft. There is no mass.     Tenderness: There is no abdominal tenderness. There is no guarding or rebound.  Musculoskeletal:        General: No tenderness. Normal range of motion.     Cervical back: Normal range of motion and neck supple.  Lymphadenopathy:     Cervical: No cervical adenopathy.  Skin:    General: Skin is warm and dry.   Findings: No erythema or rash.  Neurological:     Mental Status: She is alert and oriented to person, place, and time.     Cranial Nerves: No cranial nerve deficit.     Motor: No abnormal muscle tone.     Coordination: Coordination normal.     Deep Tendon Reflexes: Reflexes are normal and symmetric. Reflexes normal.  Psychiatric:        Behavior: Behavior normal.        Thought Content: Thought content normal.        Judgment: Judgment normal.           Assessment & Plan:  Well exam. We discussed diet and exercise. Get fasting labs.  Alysia Penna, MD

## 2020-05-15 MED FILL — ESCITALOPRAM 10 MG TABLET: 10 | 90 days supply | Qty: 90 | Fill #0

## 2020-06-05 MED FILL — ESTRADIOL 0.1 MG/24HR PTTW: 0.1 | 84 days supply | Qty: 24 | Fill #2

## 2020-06-05 MED FILL — SYNTHROID 100 MCG TABLET: 100 | 90 days supply | Qty: 90 | Fill #0

## 2020-06-05 MED FILL — POTASSIUM CHLORIDE CRYS ER: 20 | 90 days supply | Qty: 90 | Fill #0

## 2020-07-12 MED FILL — PROGESTERONE 100 MG CAPS: 100 | 90 days supply | Qty: 90 | Fill #2

## 2020-07-13 ENCOUNTER — Telehealth (INDEPENDENT_AMBULATORY_CARE_PROVIDER_SITE_OTHER): Payer: 59 | Admitting: Family Medicine

## 2020-07-13 ENCOUNTER — Encounter: Payer: Self-pay | Admitting: Family Medicine

## 2020-07-13 VITALS — HR 69 | Wt 127.0 lb

## 2020-07-13 DIAGNOSIS — R0982 Postnasal drip: Secondary | ICD-10-CM

## 2020-07-13 DIAGNOSIS — R05 Cough: Secondary | ICD-10-CM | POA: Diagnosis not present

## 2020-07-13 DIAGNOSIS — R059 Cough, unspecified: Secondary | ICD-10-CM

## 2020-07-13 NOTE — Addendum Note (Signed)
Addended by: Lucretia Kern on: 07/13/2020 05:38 PM   Modules accepted: Level of Service

## 2020-07-13 NOTE — Progress Notes (Addendum)
Virtual Visit via Video Note  I connected with Tamara Brewer  on 07/13/20 at  5:20 PM EDT by a video enabled telemedicine application and verified that I am speaking with the correct person using two identifiers.  Location patient: home, [place] Location provider:work or home office Persons participating in the virtual visit: patient, provider, husband  I discussed the limitations of evaluation and management by telemedicine and the availability of in person appointments. The patient expressed understanding and agreed to proceed.   HPI:  Acute visit for PND/resp illness: -started last week -symptoms initially included sore throat and sinus congestion, laryngitis, cough -she had a negative COVID19 test -she has been doing a nettie pot and musinex and now is feeling better today - still with some throat discomfort and drainage in the throat, reports the mucus is clear -no fever, known sick contacts, SOB, sinus pain, NVD, malaise, body aches -fully vaccinated for COVID19  ROS: See pertinent positives and negatives per HPI.  Past Medical History:  Diagnosis Date  . Allergy   . Bulimia   . Hyperlipidemia   . Hypokalemia   . Hypothyroidism   . Uterine polyp     Past Surgical History:  Procedure Laterality Date  . BREAST BIOPSY    . BUNIONECTOMY  1994   right foot   . COLONOSCOPY  10/13/2017   per Dr. Silverio Decamp, benign polyp, repeat in 10 yrs   . ENDOMETRIAL ABLATION  2009   per Dr. Louretta Shorten   . LIPOMA EXCISION     from right axilla   . MANDIBLE RECONSTRUCTION  1994    Family History  Problem Relation Age of Onset  . Breast cancer Maternal Grandmother   . Hyperlipidemia Maternal Grandfather   . Diabetes Maternal Grandfather   . Breast cancer Maternal Aunt   . Breast cancer Other   . Colon cancer Neg Hx   . Colon polyps Neg Hx   . Esophageal cancer Neg Hx   . Rectal cancer Neg Hx   . Stomach cancer Neg Hx     SOCIAL HX: see hpi   Current Outpatient Medications:  .   escitalopram (LEXAPRO) 10 MG tablet, Take 1 tablet (10 mg total) by mouth daily., Disp: 90 tablet, Rfl: 3 .  Estradiol (MINIVELLE TD), Place onto the skin., Disp: , Rfl:  .  Multiple Vitamin (MULTI-VITAMIN PO), Take by mouth., Disp: , Rfl:  .  Omega-3 Fatty Acids (FISH OIL PO), Take by mouth., Disp: , Rfl:  .  potassium chloride SA (KLOR-CON) 20 MEQ tablet, Take 1 tablet (20 mEq total) by mouth daily., Disp: 90 tablet, Rfl: 3 .  Progesterone Micronized (PROGESTERONE PO), Take by mouth., Disp: , Rfl:  .  SYNTHROID 100 MCG tablet, Take 1 tablet (100 mcg total) by mouth daily., Disp: 90 tablet, Rfl: 3  EXAM:  VITALS per patient if applicable:  GENERAL: alert, oriented, appears well and in no acute distress  HEENT: atraumatic, conjunttiva clear, no obvious abnormalities on inspection of external nose and ears  NECK: normal movements of the head and neck  LUNGS: on inspection no signs of respiratory distress, breathing rate appears normal, no obvious gross SOB, gasping or wheezing  CV: no obvious cyanosis  MS: moves all visible extremities without noticeable abnormality  PSYCH/NEURO: pleasant and cooperative, no obvious depression or anxiety, speech and thought processing grossly intact  ASSESSMENT AND PLAN:  Discussed the following assessment and plan:  PND (post-nasal drip)  Cough  -we discussed possible serious and likely etiologies,  options for evaluation and workup, limitations of telemedicine visit vs in person visit, treatment, treatment risks and precautions. Pt prefers to treat via telemedicine empirically rather then risking or undertaking an in person visit at this moment. Suspect she had a VURI which it sounds like is improving. Opted to try a short course of nasal decongestant (afrin) for 3 days and nasal saline. She declined a cough medication rx as feels that is doing better.  Advised to seek prompt follow up telemedicine visit or in person care if worsening, new  symptoms arise, or if is not continuing to improve with treatment.    I discussed the assessment and treatment plan with the patient. The patient was provided an opportunity to ask questions and all were answered. Spent 20 minutes on this appointment. The patient agreed with the plan and demonstrated an understanding of the instructions.   The patient was advised to call back or seek an in-person evaluation if the symptoms worsen or if the condition fails to improve as anticipated.   Lucretia Kern, DO

## 2020-07-13 NOTE — Patient Instructions (Signed)
INSTRUCTIONS FOR UPPER RESPIRATORY INFECTION:  -plenty of rest and fluids  -nasal saline wash 2-3 times daily (use prepackaged nasal saline or bottled/distilled water if making your own)   -can use AFRIN nasal spray for drainage and nasal congestion - but do NOT use longer then 3-4 days  -can use tylenol (in no history of liver disease) or ibuprofen (if no history of kidney disease, bowel bleeding or significant heart disease) as directed for aches and sorethroat  -in the winter time, using a humidifier at night is helpful (please follow cleaning instructions)  -if you are taking a cough medication - use only as directed, may also try a teaspoon of honey to coat the throat and throat lozenges.  -for sore throat, salt water gargles can help  -follow up if you have fevers, facial pain, tooth pain, difficulty breathing or are worsening or symptoms persist longer then expected  Upper Respiratory Infection, Adult An upper respiratory infection (URI) is also known as the common cold. It is often caused by a type of germ (virus). Colds are easily spread (contagious). You can pass it to others by kissing, coughing, sneezing, or drinking out of the same glass. Usually, you get better in 1 to 3  weeks.  However, the cough can last for even longer. HOME CARE   Only take medicine as told by your doctor. Follow instructions provided above.  Drink enough water and fluids to keep your pee (urine) clear or pale yellow.  Get plenty of rest.  Return to work when your temperature is < 100 for 24 hours or as told by your doctor. You may use a face mask and wash your hands to stop your cold from spreading. GET HELP RIGHT AWAY IF:   After the first few days, you feel you are getting worse.  You have questions about your medicine.  You have chills, shortness of breath, or red spit (mucus).  You have pain in the face for more then 1-2 days, especially when you bend forward.  You have a fever, puffy  (swollen) neck, pain when you swallow, or white spots in the back of your throat.  You have a bad headache, ear pain, sinus pain, or chest pain.  You have a high-pitched whistling sound when you breathe in and out (wheezing).  You cough up blood.  You have sore muscles or a stiff neck. MAKE SURE YOU:   Understand these instructions.  Will watch your condition.  Will get help right away if you are not doing well or get worse. Document Released: 04/15/2008 Document Revised: 01/20/2012 Document Reviewed: 02/02/2014 [employer] Patient Information 2015 [employer]. This information is not intended to replace advice given to you by your health care provider. Make sure you discuss any questions you have with your health care provider.  

## 2020-08-09 MED FILL — ESCITALOPRAM 10 MG TABLET: 10 | 90 days supply | Qty: 90 | Fill #1

## 2020-09-08 MED FILL — POTASSIUM CHLORIDE CRYS ER: 20 | 90 days supply | Qty: 90 | Fill #1

## 2020-09-08 MED FILL — ESTRADIOL 0.1 MG/24HR PTTW: 0.1 | 84 days supply | Qty: 24 | Fill #3

## 2020-09-08 MED FILL — SYNTHROID 100 MCG TABLET: 100 | 90 days supply | Qty: 90 | Fill #1

## 2020-10-10 ENCOUNTER — Other Ambulatory Visit: Payer: Self-pay | Admitting: Family Medicine

## 2020-10-10 DIAGNOSIS — Z1231 Encounter for screening mammogram for malignant neoplasm of breast: Secondary | ICD-10-CM

## 2020-10-18 ENCOUNTER — Other Ambulatory Visit (HOSPITAL_COMMUNITY): Payer: Self-pay | Admitting: Radiology

## 2020-10-18 MED FILL — PROGESTERONE 100 MG CAPS: 100 | 90 days supply | Qty: 90 | Fill #0

## 2020-10-27 ENCOUNTER — Ambulatory Visit
Admission: RE | Admit: 2020-10-27 | Discharge: 2020-10-27 | Disposition: A | Discharge status: Home / Self Care | Payer: 59 | Source: Ambulatory Visit

## 2020-10-27 ENCOUNTER — Other Ambulatory Visit: Payer: Self-pay

## 2020-10-27 DIAGNOSIS — Z1231 Encounter for screening mammogram for malignant neoplasm of breast: Secondary | ICD-10-CM | POA: Diagnosis not present

## 2020-11-09 ENCOUNTER — Other Ambulatory Visit (HOSPITAL_COMMUNITY): Payer: Self-pay | Admitting: Radiology

## 2020-11-09 DIAGNOSIS — Z6822 Body mass index (BMI) 22.0-22.9, adult: Secondary | ICD-10-CM | POA: Diagnosis not present

## 2020-11-09 DIAGNOSIS — Z01419 Encounter for gynecological examination (general) (routine) without abnormal findings: Secondary | ICD-10-CM | POA: Diagnosis not present

## 2020-11-09 DIAGNOSIS — Z79890 Hormone replacement therapy (postmenopausal): Secondary | ICD-10-CM | POA: Diagnosis not present

## 2020-11-09 DIAGNOSIS — K625 Hemorrhage of anus and rectum: Secondary | ICD-10-CM | POA: Diagnosis not present

## 2020-11-14 MED FILL — ESTRADIOL 0.1 MG/24HR PTTW: 0.1 | 84 days supply | Qty: 24 | Fill #0

## 2020-11-28 MED FILL — ESCITALOPRAM 10 MG TABLET: 10 | 90 days supply | Qty: 90 | Fill #2

## 2020-12-11 MED FILL — SYNTHROID 100 MCG TABLET: 100 | 90 days supply | Qty: 90 | Fill #2

## 2020-12-11 MED FILL — POTASSIUM CHLORIDE CRYS ER: 20 | 90 days supply | Qty: 90 | Fill #2

## 2021-01-30 ENCOUNTER — Other Ambulatory Visit (HOSPITAL_BASED_OUTPATIENT_CLINIC_OR_DEPARTMENT_OTHER): Payer: Self-pay

## 2021-02-12 ENCOUNTER — Other Ambulatory Visit (HOSPITAL_COMMUNITY): Payer: Self-pay

## 2021-02-12 MED FILL — Estradiol TD Patch Twice Weekly 0.1 MG/24HR: TRANSDERMAL | 84 days supply | Qty: 24 | Fill #0 | Status: AC

## 2021-02-13 ENCOUNTER — Other Ambulatory Visit (HOSPITAL_COMMUNITY): Payer: Self-pay

## 2021-02-16 ENCOUNTER — Other Ambulatory Visit (HOSPITAL_COMMUNITY): Payer: Self-pay

## 2021-02-23 DIAGNOSIS — H5213 Myopia, bilateral: Secondary | ICD-10-CM | POA: Diagnosis not present

## 2021-02-26 ENCOUNTER — Other Ambulatory Visit (HOSPITAL_COMMUNITY): Payer: Self-pay

## 2021-02-26 MED FILL — Escitalopram Oxalate Tab 10 MG (Base Equiv): ORAL | 90 days supply | Qty: 90 | Fill #0 | Status: AC

## 2021-03-07 ENCOUNTER — Other Ambulatory Visit (HOSPITAL_COMMUNITY): Payer: Self-pay

## 2021-03-07 MED FILL — Levothyroxine Sodium Tab 100 MCG: ORAL | 90 days supply | Qty: 90 | Fill #0 | Status: AC

## 2021-03-19 ENCOUNTER — Other Ambulatory Visit (HOSPITAL_COMMUNITY): Payer: Self-pay

## 2021-03-19 MED FILL — Potassium Chloride Microencapsulated Crys ER Tab 20 mEq: ORAL | 90 days supply | Qty: 90 | Fill #0 | Status: AC

## 2021-04-25 ENCOUNTER — Other Ambulatory Visit (HOSPITAL_COMMUNITY): Payer: Self-pay

## 2021-04-25 MED FILL — Estradiol TD Patch Twice Weekly 0.1 MG/24HR: TRANSDERMAL | 84 days supply | Qty: 24 | Fill #1 | Status: AC

## 2021-04-25 MED FILL — Progesterone Cap 100 MG: ORAL | 90 days supply | Qty: 90 | Fill #0 | Status: AC

## 2021-05-24 ENCOUNTER — Other Ambulatory Visit: Payer: Self-pay | Admitting: Family Medicine

## 2021-05-24 ENCOUNTER — Other Ambulatory Visit (HOSPITAL_COMMUNITY): Payer: Self-pay

## 2021-05-24 MED ORDER — SYNTHROID 100 MCG PO TABS
100.0000 ug | ORAL_TABLET | Freq: Every day | ORAL | 3 refills | Status: DC
  Filled 2021-05-24: qty 90, 90d supply, fill #0

## 2021-05-25 ENCOUNTER — Other Ambulatory Visit (HOSPITAL_COMMUNITY): Payer: Self-pay

## 2021-05-25 MED ORDER — ESCITALOPRAM OXALATE 10 MG PO TABS
ORAL_TABLET | Freq: Every day | ORAL | 3 refills | Status: DC
  Filled 2021-05-25: qty 90, 90d supply, fill #0

## 2021-06-26 ENCOUNTER — Other Ambulatory Visit (HOSPITAL_COMMUNITY): Payer: Self-pay

## 2021-06-26 ENCOUNTER — Other Ambulatory Visit: Payer: Self-pay | Admitting: Family Medicine

## 2021-06-26 MED ORDER — POTASSIUM CHLORIDE CRYS ER 20 MEQ PO TBCR
EXTENDED_RELEASE_TABLET | Freq: Every day | ORAL | 0 refills | Status: DC
Start: 2021-06-26 — End: 2021-07-13
  Filled 2021-06-26: qty 90, 90d supply, fill #0

## 2021-07-12 ENCOUNTER — Other Ambulatory Visit: Payer: Self-pay

## 2021-07-13 ENCOUNTER — Encounter: Payer: Self-pay | Admitting: Family Medicine

## 2021-07-13 ENCOUNTER — Other Ambulatory Visit (HOSPITAL_COMMUNITY): Payer: Self-pay

## 2021-07-13 ENCOUNTER — Ambulatory Visit (INDEPENDENT_AMBULATORY_CARE_PROVIDER_SITE_OTHER): Payer: 59 | Admitting: Family Medicine

## 2021-07-13 VITALS — BP 98/72 | HR 52 | Temp 97.8°F | Ht 63.75 in | Wt 124.0 lb

## 2021-07-13 DIAGNOSIS — Z0000 Encounter for general adult medical examination without abnormal findings: Secondary | ICD-10-CM

## 2021-07-13 LAB — CBC WITH DIFFERENTIAL/PLATELET
Basophils Absolute: 0.1 10*3/uL (ref 0.0–0.1)
Basophils Relative: 1.2 % (ref 0.0–3.0)
Eosinophils Absolute: 0.3 10*3/uL (ref 0.0–0.7)
Eosinophils Relative: 6 % — ABNORMAL HIGH (ref 0.0–5.0)
HCT: 40.9 % (ref 36.0–46.0)
Hemoglobin: 13.9 g/dL (ref 12.0–15.0)
Lymphocytes Relative: 32.1 % (ref 12.0–46.0)
Lymphs Abs: 1.8 10*3/uL (ref 0.7–4.0)
MCHC: 34.1 g/dL (ref 30.0–36.0)
MCV: 94.4 fl (ref 78.0–100.0)
Monocytes Absolute: 0.5 10*3/uL (ref 0.1–1.0)
Monocytes Relative: 8.2 % (ref 3.0–12.0)
Neutro Abs: 2.9 10*3/uL (ref 1.4–7.7)
Neutrophils Relative %: 52.5 % (ref 43.0–77.0)
Platelets: 251 10*3/uL (ref 150.0–400.0)
RBC: 4.34 Mil/uL (ref 3.87–5.11)
RDW: 13.4 % (ref 11.5–15.5)
WBC: 5.5 10*3/uL (ref 4.0–10.5)

## 2021-07-13 LAB — BASIC METABOLIC PANEL
BUN: 13 mg/dL (ref 6–23)
CO2: 28 mEq/L (ref 19–32)
Calcium: 9.6 mg/dL (ref 8.4–10.5)
Chloride: 99 mEq/L (ref 96–112)
Creatinine, Ser: 0.76 mg/dL (ref 0.40–1.20)
GFR: 88.8 mL/min (ref >60.00)
Glucose, Bld: 80 mg/dL (ref 70–99)
Potassium: 4 mEq/L (ref 3.5–5.1)
Sodium: 136 mEq/L (ref 135–145)

## 2021-07-13 LAB — LIPID PANEL
Cholesterol: 201 mg/dL — ABNORMAL HIGH (ref 0–200)
HDL: 87.4 mg/dL (ref >39.00)
LDL Cholesterol: 99 mg/dL (ref 0–99)
NonHDL: 113.95
Total CHOL/HDL Ratio: 2
Triglycerides: 77 mg/dL (ref 0.0–149.0)
VLDL: 15.4 mg/dL (ref 0.0–40.0)

## 2021-07-13 LAB — HEPATIC FUNCTION PANEL
ALT: 17 U/L (ref 0–35)
AST: 21 U/L (ref 0–37)
Albumin: 4.6 g/dL (ref 3.5–5.2)
Alkaline Phosphatase: 57 U/L (ref 39–117)
Bilirubin, Direct: 0.1 mg/dL (ref 0.0–0.3)
Total Bilirubin: 0.6 mg/dL (ref 0.2–1.2)
Total Protein: 7.5 g/dL (ref 6.0–8.3)

## 2021-07-13 LAB — HEMOGLOBIN A1C: Hgb A1c MFr Bld: 5.7 % (ref 4.6–6.5)

## 2021-07-13 LAB — TSH: TSH: 0.29 u[IU]/mL — ABNORMAL LOW (ref 0.35–5.50)

## 2021-07-13 LAB — T4, FREE: Free T4: 0.88 ng/dL (ref 0.60–1.60)

## 2021-07-13 LAB — T3, FREE: T3, Free: 2.9 pg/mL (ref 2.3–4.2)

## 2021-07-13 MED ORDER — POTASSIUM CHLORIDE CRYS ER 20 MEQ PO TBCR
EXTENDED_RELEASE_TABLET | Freq: Every day | ORAL | 3 refills | Status: DC
  Filled 2021-07-13: qty 90, fill #0
  Filled 2021-10-01: qty 90, 90d supply, fill #0
  Filled 2022-01-15: qty 90, 90d supply, fill #1
  Filled 2022-04-22: qty 90, 90d supply, fill #2

## 2021-07-13 MED ORDER — SYNTHROID 100 MCG PO TABS
100.0000 ug | ORAL_TABLET | Freq: Every day | ORAL | 3 refills | Status: DC
  Filled 2021-07-13 – 2021-09-19 (×2): qty 90, 90d supply, fill #0
  Filled 2021-12-19: qty 90, 90d supply, fill #1
  Filled 2022-03-13 – 2022-03-26 (×2): qty 90, 90d supply, fill #2
  Filled 2022-06-26: qty 90, 90d supply, fill #3

## 2021-07-13 MED ORDER — ESCITALOPRAM OXALATE 10 MG PO TABS
ORAL_TABLET | Freq: Every day | ORAL | 3 refills | Status: DC
Start: 2021-07-13 — End: 2022-07-01
  Filled 2021-07-13: qty 90, fill #0
  Filled 2021-09-07: qty 90, 90d supply, fill #0
  Filled 2021-12-12: qty 90, 90d supply, fill #1
  Filled 2022-03-05: qty 90, 90d supply, fill #2
  Filled 2022-06-06: qty 90, 90d supply, fill #3

## 2021-07-13 NOTE — Addendum Note (Signed)
Addended by: Amanda Cockayne on: 07/13/2021 10:33 AM   Modules accepted: Orders

## 2021-07-13 NOTE — Progress Notes (Signed)
   Subjective:    Patient ID: Tamara Brewer, female    DOB: 1966-06-03, 55 y.o.   MRN: [id number]  HPI Here for a well exam. She feels fine.    Review of Systems  Constitutional: Negative.   HENT: Negative.    Eyes: Negative.   Respiratory: Negative.    Cardiovascular: Negative.   Gastrointestinal: Negative.   Genitourinary:  Negative for decreased urine volume, difficulty urinating, dyspareunia, dysuria, enuresis, flank pain, frequency, hematuria, pelvic pain and urgency.  Musculoskeletal: Negative.   Skin: Negative.   Neurological: Negative.  Negative for headaches.  Psychiatric/Behavioral: Negative.        Objective:   Physical Exam Constitutional:      General: She is not in acute distress.    Appearance: Normal appearance. She is well-developed.  HENT:     Head: Normocephalic and atraumatic.     Right Ear: External ear normal.     Left Ear: External ear normal.     Nose: Nose normal.     Mouth/Throat:     Pharynx: No oropharyngeal exudate.  Eyes:     General: No scleral icterus.    Conjunctiva/sclera: Conjunctivae normal.     Pupils: Pupils are equal, round, and reactive to light.  Neck:     Thyroid: No thyromegaly.     Vascular: No JVD.  Cardiovascular:     Rate and Rhythm: Normal rate and regular rhythm.     Heart sounds: Normal heart sounds. No murmur heard.   No friction rub. No gallop.  Pulmonary:     Effort: Pulmonary effort is normal. No respiratory distress.     Breath sounds: Normal breath sounds. No wheezing or rales.  Chest:     Chest wall: No tenderness.  Abdominal:     General: Bowel sounds are normal. There is no distension.     Palpations: Abdomen is soft. There is no mass.     Tenderness: There is no abdominal tenderness. There is no guarding or rebound.  Musculoskeletal:        General: No tenderness. Normal range of motion.     Cervical back: Normal range of motion and neck supple.  Lymphadenopathy:     Cervical: No cervical adenopathy.   Skin:    General: Skin is warm and dry.     Findings: No erythema or rash.  Neurological:     Mental Status: She is alert and oriented to person, place, and time.     Cranial Nerves: No cranial nerve deficit.     Motor: No abnormal muscle tone.     Coordination: Coordination normal.     Deep Tendon Reflexes: Reflexes are normal and symmetric. Reflexes normal.  Psychiatric:        Behavior: Behavior normal.        Thought Content: Thought content normal.        Judgment: Judgment normal.          Assessment & Plan:  Well exam. We discussed diet and exercise. Get fasting labs. Alysia Penna, MD

## 2021-07-23 ENCOUNTER — Other Ambulatory Visit (HOSPITAL_COMMUNITY): Payer: Self-pay

## 2021-07-23 MED FILL — Progesterone Cap 100 MG: ORAL | 90 days supply | Qty: 90 | Fill #1 | Status: AC

## 2021-07-23 MED FILL — Estradiol TD Patch Twice Weekly 0.1 MG/24HR: TRANSDERMAL | 84 days supply | Qty: 24 | Fill #2 | Status: AC

## 2021-09-02 IMAGING — US US BREAST*R* LIMITED INC AXILLA
1 series · 3 of 3 positions shown · non-contrast
Comparison: Previous exam(s).

CLINICAL DATA: Patient was called back from screening mammogram of
for a possible mass in the right breast.

EXAM:
DIGITAL DIAGNOSTIC RIGHT MAMMOGRAM WITH CAD AND TOMO
ULTRASOUND RIGHT BREAST

[Series 1: us breast*right* limited inc axilla · 0.06mm/px · 3 of 3 slices shown]
[im 1/3]
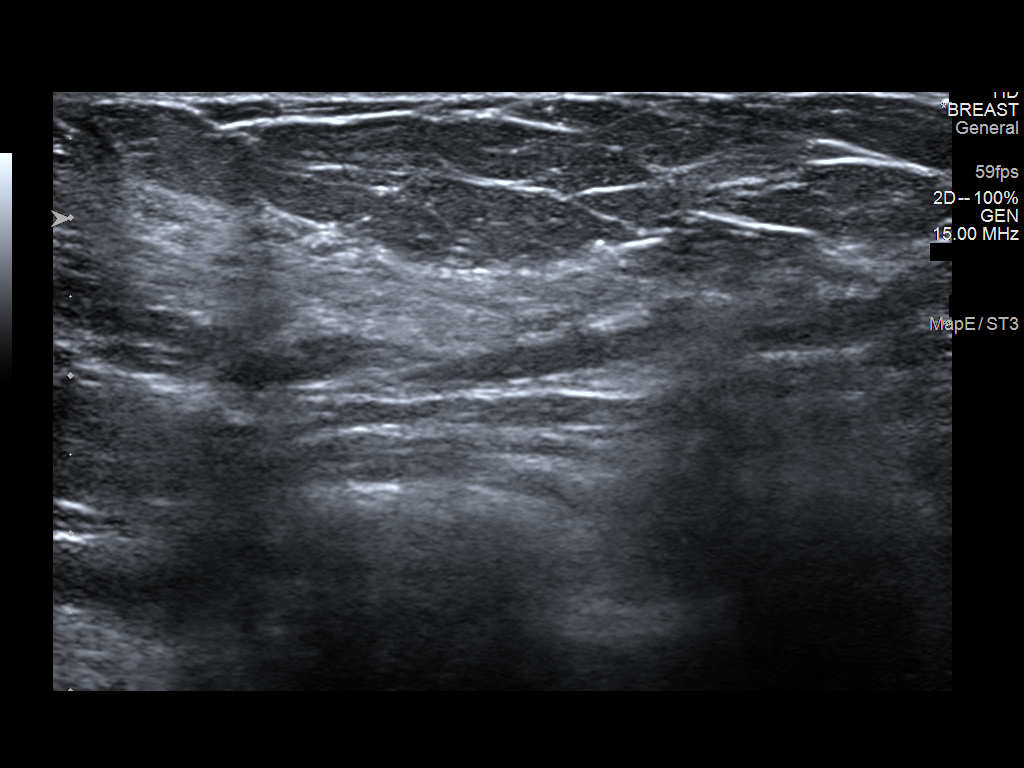
[im 2/3]
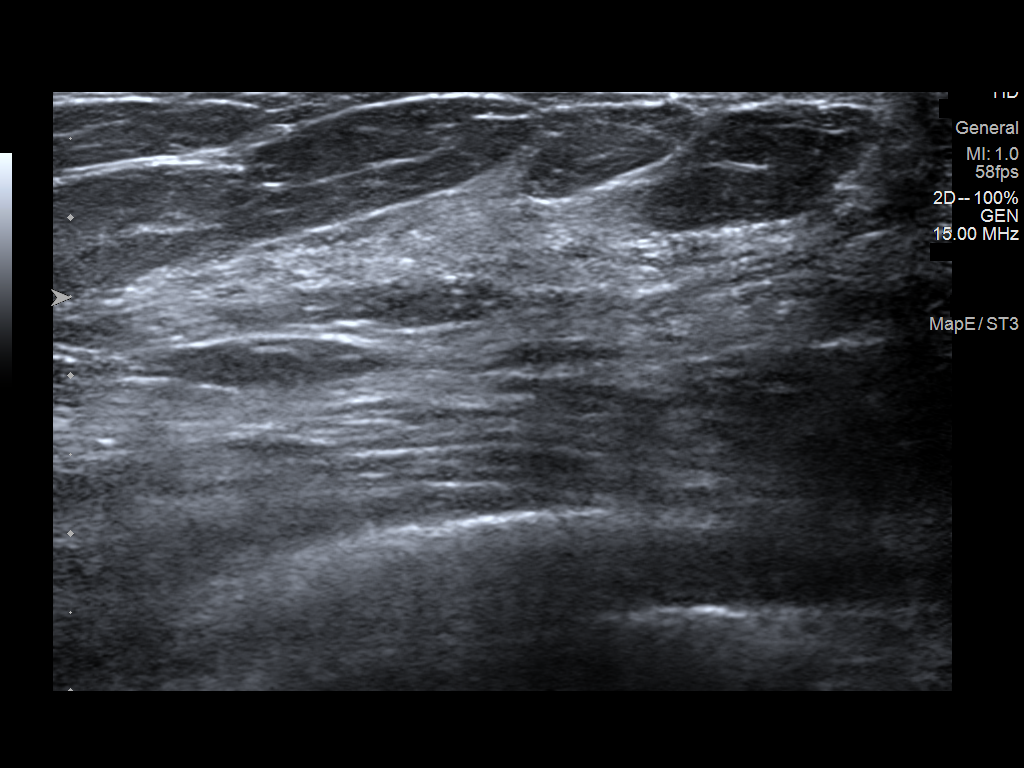
[im 3/3]
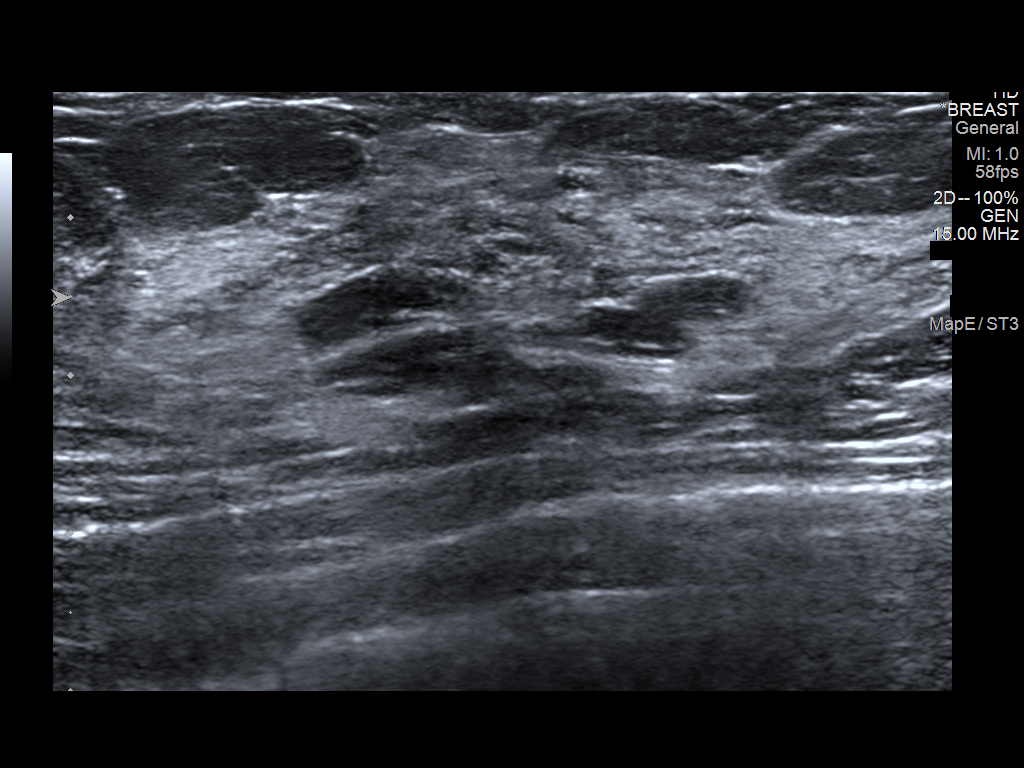

[3 of 3 positions shown; findings below may reference images not displayed]

ACR Breast Density Category c: The breast tissue is heterogeneously
dense, which may obscure small masses.
FINDINGS: Additional imaging of the right breast was performed. No suspicious
mass, malignant type microcalcifications or distortion detected.

Mammographic images were processed with CAD.

On physical exam, I do not palpate a mass in the medial aspect of
the right breast.

Targeted ultrasound is performed, showing normal tissue in the
medial aspect of the right breast. No solid or cystic mass, abnormal
shadowing or distortion visualized.
IMPRESSION: No evidence malignancy in the right breast.

RECOMMENDATION:
Bilateral screening mammogram in Friday September, 2020 is recommended.

I have discussed the findings and recommendations with the patient.
If applicable, a reminder letter will be sent to the patient
regarding the next appointment.

BI-RADS CATEGORY  1: Negative.

## 2021-09-07 ENCOUNTER — Other Ambulatory Visit (HOSPITAL_COMMUNITY): Payer: Self-pay

## 2021-09-19 ENCOUNTER — Other Ambulatory Visit (HOSPITAL_COMMUNITY): Payer: Self-pay

## 2021-09-25 ENCOUNTER — Other Ambulatory Visit: Payer: Self-pay | Admitting: Family Medicine

## 2021-09-25 DIAGNOSIS — Z1231 Encounter for screening mammogram for malignant neoplasm of breast: Secondary | ICD-10-CM

## 2021-10-01 ENCOUNTER — Other Ambulatory Visit (HOSPITAL_COMMUNITY): Payer: Self-pay

## 2021-10-01 MED FILL — Estradiol TD Patch Twice Weekly 0.1 MG/24HR: TRANSDERMAL | 84 days supply | Qty: 24 | Fill #3 | Status: AC

## 2021-10-01 MED FILL — Progesterone Cap 100 MG: ORAL | 90 days supply | Qty: 90 | Fill #2 | Status: CN

## 2021-10-30 ENCOUNTER — Ambulatory Visit
Admission: RE | Admit: 2021-10-30 | Discharge: 2021-10-30 | Disposition: A | Discharge status: Home / Self Care | Payer: 59 | Source: Ambulatory Visit

## 2021-10-30 DIAGNOSIS — Z1231 Encounter for screening mammogram for malignant neoplasm of breast: Secondary | ICD-10-CM | POA: Diagnosis not present

## 2021-11-01 ENCOUNTER — Other Ambulatory Visit (HOSPITAL_COMMUNITY): Payer: Self-pay

## 2021-11-01 MED FILL — Progesterone Cap 100 MG: ORAL | 90 days supply | Qty: 90 | Fill #2 | Status: AC

## 2021-12-10 ENCOUNTER — Other Ambulatory Visit (HOSPITAL_COMMUNITY): Payer: Self-pay

## 2021-12-10 DIAGNOSIS — Z6822 Body mass index (BMI) 22.0-22.9, adult: Secondary | ICD-10-CM | POA: Diagnosis not present

## 2021-12-10 DIAGNOSIS — Z01419 Encounter for gynecological examination (general) (routine) without abnormal findings: Secondary | ICD-10-CM | POA: Diagnosis not present

## 2021-12-10 MED ORDER — PROGESTERONE MICRONIZED 100 MG PO CAPS
100.0000 mg | ORAL_CAPSULE | Freq: Every day | ORAL | 3 refills | Status: DC
Start: 2021-12-10 — End: 2023-01-03
  Filled 2021-12-10 – 2022-02-06 (×2): qty 90, 90d supply, fill #0
  Filled 2022-05-20: qty 90, 90d supply, fill #1
  Filled 2022-08-23: qty 90, 90d supply, fill #2
  Filled 2022-11-19: qty 90, 90d supply, fill #3

## 2021-12-10 MED ORDER — ESTRADIOL 0.1 MG/24HR TD PTTW
MEDICATED_PATCH | TRANSDERMAL | 3 refills | Status: DC
  Filled 2021-12-10: qty 24, 84d supply, fill #0
  Filled 2022-03-05: qty 24, 84d supply, fill #1
  Filled 2022-05-20: qty 24, 84d supply, fill #2
  Filled 2022-08-23: qty 24, 84d supply, fill #3

## 2021-12-11 ENCOUNTER — Other Ambulatory Visit (HOSPITAL_COMMUNITY): Payer: Self-pay

## 2021-12-11 MED ORDER — CLOBETASOL PROPIONATE 0.05 % EX CREA
TOPICAL_CREAM | CUTANEOUS | 2 refills | Status: AC
  Filled 2021-12-11: qty 30, 30d supply, fill #0

## 2021-12-12 ENCOUNTER — Other Ambulatory Visit (HOSPITAL_COMMUNITY): Payer: Self-pay

## 2021-12-19 ENCOUNTER — Other Ambulatory Visit (HOSPITAL_COMMUNITY): Payer: Self-pay

## 2022-01-15 ENCOUNTER — Other Ambulatory Visit (HOSPITAL_COMMUNITY): Payer: Self-pay

## 2022-02-06 ENCOUNTER — Other Ambulatory Visit (HOSPITAL_COMMUNITY): Payer: Self-pay

## 2022-03-05 ENCOUNTER — Other Ambulatory Visit (HOSPITAL_COMMUNITY): Payer: Self-pay

## 2022-03-13 ENCOUNTER — Other Ambulatory Visit (HOSPITAL_COMMUNITY): Payer: Self-pay

## 2022-03-14 DIAGNOSIS — H5213 Myopia, bilateral: Secondary | ICD-10-CM | POA: Diagnosis not present

## 2022-03-26 ENCOUNTER — Other Ambulatory Visit (HOSPITAL_COMMUNITY): Payer: Self-pay

## 2022-04-22 ENCOUNTER — Other Ambulatory Visit (HOSPITAL_COMMUNITY): Payer: Self-pay

## 2022-05-20 ENCOUNTER — Other Ambulatory Visit (HOSPITAL_COMMUNITY): Payer: Self-pay

## 2022-05-27 ENCOUNTER — Other Ambulatory Visit (HOSPITAL_COMMUNITY): Payer: Self-pay

## 2022-06-06 ENCOUNTER — Other Ambulatory Visit (HOSPITAL_COMMUNITY): Payer: Self-pay

## 2022-06-07 ENCOUNTER — Other Ambulatory Visit (HOSPITAL_COMMUNITY): Payer: Self-pay

## 2022-06-26 ENCOUNTER — Other Ambulatory Visit (HOSPITAL_COMMUNITY): Payer: Self-pay

## 2022-06-28 DIAGNOSIS — D225 Melanocytic nevi of trunk: Secondary | ICD-10-CM | POA: Diagnosis not present

## 2022-06-28 DIAGNOSIS — L814 Other melanin hyperpigmentation: Secondary | ICD-10-CM | POA: Diagnosis not present

## 2022-07-01 ENCOUNTER — Encounter: Payer: Self-pay | Admitting: Family Medicine

## 2022-07-01 ENCOUNTER — Other Ambulatory Visit (HOSPITAL_COMMUNITY): Payer: Self-pay

## 2022-07-01 ENCOUNTER — Ambulatory Visit (INDEPENDENT_AMBULATORY_CARE_PROVIDER_SITE_OTHER): Payer: 59 | Admitting: Family Medicine

## 2022-07-01 VITALS — BP 98/68 | HR 69 | Temp 98.1°F | Ht 63.0 in | Wt 122.5 lb

## 2022-07-01 DIAGNOSIS — Z0000 Encounter for general adult medical examination without abnormal findings: Secondary | ICD-10-CM | POA: Diagnosis not present

## 2022-07-01 DIAGNOSIS — E039 Hypothyroidism, unspecified: Secondary | ICD-10-CM

## 2022-07-01 DIAGNOSIS — R251 Tremor, unspecified: Secondary | ICD-10-CM | POA: Diagnosis not present

## 2022-07-01 MED ORDER — ESCITALOPRAM OXALATE 10 MG PO TABS
10.0000 mg | ORAL_TABLET | Freq: Every day | ORAL | 3 refills | Status: DC
  Filled 2022-07-01: qty 90, fill #0
  Filled 2022-09-04: qty 90, 90d supply, fill #0
  Filled 2022-11-29: qty 90, 90d supply, fill #1
  Filled 2023-02-27: qty 90, 90d supply, fill #2
  Filled 2023-05-28: qty 90, 90d supply, fill #3

## 2022-07-01 MED ORDER — SYNTHROID 100 MCG PO TABS
100.0000 ug | ORAL_TABLET | Freq: Every day | ORAL | 3 refills | Status: DC
  Filled 2022-07-01 – 2022-09-19 (×2): qty 90, 90d supply, fill #0
  Filled 2022-12-19: qty 30, 30d supply, fill #1

## 2022-07-01 MED ORDER — POTASSIUM CHLORIDE CRYS ER 20 MEQ PO TBCR
20.0000 meq | EXTENDED_RELEASE_TABLET | Freq: Every day | ORAL | 3 refills | Status: DC
Start: 2022-07-01 — End: 2023-07-21
  Filled 2022-07-01: qty 90, fill #0
  Filled 2022-07-17: qty 90, 90d supply, fill #0
  Filled 2022-10-13: qty 90, 90d supply, fill #1
  Filled 2023-01-09: qty 90, 90d supply, fill #2
  Filled 2023-04-09: qty 90, 90d supply, fill #3

## 2022-07-01 NOTE — Progress Notes (Signed)
Subjective:    Patient ID: Tamara Brewer, female    DOB: 10-May-1966, 56 y.o.   MRN: [id number]  HPI Here for a well exam. Her only concern is some mild tremors she gets in her hands (right more than left) and the right leg. These started a few months ago. They do not affect her activities at all. No trouble eating, writing, etc. They come and go.    Review of Systems  Constitutional: Negative.   HENT: Negative.    Eyes: Negative.   Respiratory: Negative.    Cardiovascular: Negative.   Gastrointestinal: Negative.   Genitourinary:  Negative for decreased urine volume, difficulty urinating, dyspareunia, dysuria, enuresis, flank pain, frequency, hematuria, pelvic pain and urgency.  Musculoskeletal: Negative.   Skin: Negative.   Neurological:  Positive for tremors. Negative for headaches.  Psychiatric/Behavioral: Negative.         Objective:   Physical Exam Constitutional:      General: She is not in acute distress.    Appearance: Normal appearance. She is well-developed.  HENT:     Head: Normocephalic and atraumatic.     Right Ear: External ear normal.     Left Ear: External ear normal.     Nose: Nose normal.     Mouth/Throat:     Pharynx: No oropharyngeal exudate.  Eyes:     General: No scleral icterus.    Conjunctiva/sclera: Conjunctivae normal.     Pupils: Pupils are equal, round, and reactive to light.  Neck:     Thyroid: No thyromegaly.     Vascular: No JVD.  Cardiovascular:     Rate and Rhythm: Normal rate and regular rhythm.     Heart sounds: Normal heart sounds. No murmur heard.    No friction rub. No gallop.  Pulmonary:     Effort: Pulmonary effort is normal. No respiratory distress.     Breath sounds: Normal breath sounds. No wheezing or rales.  Chest:     Chest wall: No tenderness.  Abdominal:     General: Bowel sounds are normal. There is no distension.     Palpations: Abdomen is soft. There is no mass.     Tenderness: There is no abdominal tenderness.  There is no guarding or rebound.  Musculoskeletal:        General: No tenderness. Normal range of motion.     Cervical back: Normal range of motion and neck supple.  Lymphadenopathy:     Cervical: No cervical adenopathy.  Skin:    General: Skin is warm and dry.     Findings: No erythema or rash.  Neurological:     Mental Status: She is alert and oriented to person, place, and time.     Cranial Nerves: No cranial nerve deficit.     Motor: No abnormal muscle tone.     Coordination: Coordination normal.     Deep Tendon Reflexes: Reflexes are normal and symmetric. Reflexes normal.     Comments: No evidence of tremors at all today   Psychiatric:        Behavior: Behavior normal.        Thought Content: Thought content normal.        Judgment: Judgment normal.           Assessment & Plan:  Well exam. We discussed diet and exercise. Get fasting labs. The tremors appear to be essential tremors. We discussed these. She is aware that stress, fatigue, and caffeine can contribute to these. We will  check her thyroid levels. She will let us know if these get any worse. Alysia Penna, MD

## 2022-07-03 ENCOUNTER — Other Ambulatory Visit: Payer: 59

## 2022-07-05 ENCOUNTER — Other Ambulatory Visit (INDEPENDENT_AMBULATORY_CARE_PROVIDER_SITE_OTHER): Payer: 59

## 2022-07-05 DIAGNOSIS — E039 Hypothyroidism, unspecified: Secondary | ICD-10-CM | POA: Diagnosis not present

## 2022-07-05 DIAGNOSIS — Z0000 Encounter for general adult medical examination without abnormal findings: Secondary | ICD-10-CM

## 2022-07-05 LAB — CBC WITH DIFFERENTIAL/PLATELET
Basophils Absolute: 0.1 10*3/uL (ref 0.0–0.1)
Basophils Relative: 1.4 % (ref 0.0–3.0)
Eosinophils Absolute: 0.1 10*3/uL (ref 0.0–0.7)
Eosinophils Relative: 3 % (ref 0.0–5.0)
HCT: 39.9 % (ref 36.0–46.0)
Hemoglobin: 13.6 g/dL (ref 12.0–15.0)
Lymphocytes Relative: 39 % (ref 12.0–46.0)
Lymphs Abs: 1.8 10*3/uL (ref 0.7–4.0)
MCHC: 34.2 g/dL (ref 30.0–36.0)
MCV: 94.5 fl (ref 78.0–100.0)
Monocytes Absolute: 0.5 10*3/uL (ref 0.1–1.0)
Monocytes Relative: 10.1 % (ref 3.0–12.0)
Neutro Abs: 2.2 10*3/uL (ref 1.4–7.7)
Neutrophils Relative %: 46.5 % (ref 43.0–77.0)
Platelets: 244 10*3/uL (ref 150.0–400.0)
RBC: 4.22 Mil/uL (ref 3.87–5.11)
RDW: 13.2 % (ref 11.5–15.5)
WBC: 4.7 10*3/uL (ref 4.0–10.5)

## 2022-07-05 LAB — HEPATIC FUNCTION PANEL
ALT: 16 U/L (ref 0–35)
AST: 22 U/L (ref 0–37)
Albumin: 4.3 g/dL (ref 3.5–5.2)
Alkaline Phosphatase: 53 U/L (ref 39–117)
Bilirubin, Direct: 0.1 mg/dL (ref 0.0–0.3)
Total Bilirubin: 0.6 mg/dL (ref 0.2–1.2)
Total Protein: 6.7 g/dL (ref 6.0–8.3)

## 2022-07-05 LAB — BASIC METABOLIC PANEL
BUN: 13 mg/dL (ref 6–23)
CO2: 29 mEq/L (ref 19–32)
Calcium: 9.2 mg/dL (ref 8.4–10.5)
Chloride: 101 mEq/L (ref 96–112)
Creatinine, Ser: 0.86 mg/dL (ref 0.40–1.20)
GFR: 76.03 mL/min (ref >60.00)
Glucose, Bld: 88 mg/dL (ref 70–99)
Potassium: 3.7 mEq/L (ref 3.5–5.1)
Sodium: 138 mEq/L (ref 135–145)

## 2022-07-05 LAB — T3, FREE: T3, Free: 2.5 pg/mL (ref 2.3–4.2)

## 2022-07-05 LAB — LIPID PANEL
Cholesterol: 192 mg/dL (ref 0–200)
HDL: 79.1 mg/dL (ref >39.00)
LDL Cholesterol: 97 mg/dL (ref 0–99)
NonHDL: 112.59
Total CHOL/HDL Ratio: 2
Triglycerides: 76 mg/dL (ref 0.0–149.0)
VLDL: 15.2 mg/dL (ref 0.0–40.0)

## 2022-07-05 LAB — T4, FREE: Free T4: 0.99 ng/dL (ref 0.60–1.60)

## 2022-07-05 LAB — TSH: TSH: 0.49 u[IU]/mL (ref 0.35–5.50)

## 2022-07-05 LAB — HEMOGLOBIN A1C: Hgb A1c MFr Bld: 5.8 % (ref 4.6–6.5)

## 2022-07-18 ENCOUNTER — Other Ambulatory Visit (HOSPITAL_COMMUNITY): Payer: Self-pay

## 2022-08-23 ENCOUNTER — Other Ambulatory Visit (HOSPITAL_COMMUNITY): Payer: Self-pay

## 2022-09-05 ENCOUNTER — Other Ambulatory Visit (HOSPITAL_COMMUNITY): Payer: Self-pay

## 2022-09-19 ENCOUNTER — Other Ambulatory Visit (HOSPITAL_COMMUNITY): Payer: Self-pay

## 2022-09-26 ENCOUNTER — Other Ambulatory Visit: Payer: Self-pay | Admitting: Obstetrics & Gynecology

## 2022-09-26 DIAGNOSIS — Z1231 Encounter for screening mammogram for malignant neoplasm of breast: Secondary | ICD-10-CM

## 2022-10-13 ENCOUNTER — Other Ambulatory Visit (HOSPITAL_COMMUNITY): Payer: Self-pay

## 2022-10-14 ENCOUNTER — Other Ambulatory Visit (HOSPITAL_COMMUNITY): Payer: Self-pay

## 2022-11-13 ENCOUNTER — Other Ambulatory Visit (HOSPITAL_COMMUNITY): Payer: Self-pay

## 2022-11-13 MED ORDER — ESTRADIOL 0.1 MG/24HR TD PTTW
1.0000 | MEDICATED_PATCH | TRANSDERMAL | 0 refills | Status: DC
  Filled 2022-11-13 – 2022-11-15 (×2): qty 24, 84d supply, fill #0

## 2022-11-14 ENCOUNTER — Ambulatory Visit
Admission: RE | Admit: 2022-11-14 | Discharge: 2022-11-14 | Disposition: A | Discharge status: Home / Self Care | Payer: Commercial Managed Care - PPO | Source: Ambulatory Visit

## 2022-11-14 DIAGNOSIS — Z1231 Encounter for screening mammogram for malignant neoplasm of breast: Secondary | ICD-10-CM | POA: Diagnosis not present

## 2022-11-15 ENCOUNTER — Other Ambulatory Visit (HOSPITAL_COMMUNITY): Payer: Self-pay

## 2022-11-15 ENCOUNTER — Other Ambulatory Visit: Payer: Self-pay

## 2022-11-19 ENCOUNTER — Other Ambulatory Visit (HOSPITAL_COMMUNITY): Payer: Self-pay

## 2022-11-29 ENCOUNTER — Other Ambulatory Visit (HOSPITAL_COMMUNITY): Payer: Self-pay

## 2022-12-19 ENCOUNTER — Encounter: Payer: Self-pay | Admitting: Family Medicine

## 2022-12-19 ENCOUNTER — Other Ambulatory Visit: Payer: Self-pay

## 2022-12-20 ENCOUNTER — Other Ambulatory Visit (HOSPITAL_COMMUNITY): Payer: Self-pay

## 2022-12-20 ENCOUNTER — Other Ambulatory Visit: Payer: Self-pay

## 2022-12-20 MED ORDER — LEVOTHYROXINE SODIUM 100 MCG PO TABS
100.0000 ug | ORAL_TABLET | Freq: Every day | ORAL | 3 refills | Status: DC
  Filled 2022-12-20: qty 90, 90d supply, fill #0
  Filled 2023-03-18: qty 90, 90d supply, fill #1
  Filled 2023-06-16: qty 90, 90d supply, fill #2

## 2022-12-20 NOTE — Telephone Encounter (Signed)
Done

## 2022-12-23 ENCOUNTER — Other Ambulatory Visit: Payer: Self-pay

## 2023-01-03 ENCOUNTER — Other Ambulatory Visit (HOSPITAL_COMMUNITY): Payer: Self-pay

## 2023-01-03 DIAGNOSIS — Z01419 Encounter for gynecological examination (general) (routine) without abnormal findings: Secondary | ICD-10-CM | POA: Diagnosis not present

## 2023-01-03 DIAGNOSIS — N952 Postmenopausal atrophic vaginitis: Secondary | ICD-10-CM | POA: Diagnosis not present

## 2023-01-03 DIAGNOSIS — Z6821 Body mass index (BMI) 21.0-21.9, adult: Secondary | ICD-10-CM | POA: Diagnosis not present

## 2023-01-03 DIAGNOSIS — Z79890 Hormone replacement therapy (postmenopausal): Secondary | ICD-10-CM | POA: Diagnosis not present

## 2023-01-03 MED ORDER — PROGESTERONE MICRONIZED 100 MG PO CAPS
100.0000 mg | ORAL_CAPSULE | Freq: Every day | ORAL | 3 refills | Status: DC
  Filled 2023-01-03 – 2023-02-18 (×2): qty 90, 90d supply, fill #0
  Filled 2023-05-19: qty 90, 90d supply, fill #1

## 2023-01-03 MED ORDER — PREMARIN 0.625 MG/GM VA CREA
0.5000 g | TOPICAL_CREAM | Freq: Every evening | VAGINAL | 3 refills | Status: DC
  Filled 2023-01-03: qty 30, 90d supply, fill #0
  Filled 2023-01-09 – 2023-02-27 (×3): qty 30, 60d supply, fill #0

## 2023-01-03 MED ORDER — ESTRADIOL 0.1 MG/24HR TD PTTW
MEDICATED_PATCH | TRANSDERMAL | 3 refills | Status: DC
  Filled 2023-01-09: qty 24, fill #0
  Filled 2023-02-04: qty 24, 84d supply, fill #0
  Filled 2023-04-22 – 2023-04-30 (×2): qty 24, 84d supply, fill #1
  Filled 2023-07-08: qty 24, 84d supply, fill #2

## 2023-01-09 ENCOUNTER — Other Ambulatory Visit: Payer: Self-pay

## 2023-02-05 ENCOUNTER — Other Ambulatory Visit (HOSPITAL_COMMUNITY): Payer: Self-pay

## 2023-02-05 ENCOUNTER — Other Ambulatory Visit: Payer: Self-pay

## 2023-02-18 ENCOUNTER — Other Ambulatory Visit (HOSPITAL_COMMUNITY): Payer: Self-pay

## 2023-02-18 ENCOUNTER — Other Ambulatory Visit: Payer: Self-pay

## 2023-02-28 ENCOUNTER — Other Ambulatory Visit: Payer: Self-pay

## 2023-02-28 ENCOUNTER — Other Ambulatory Visit (HOSPITAL_COMMUNITY): Payer: Self-pay

## 2023-03-18 ENCOUNTER — Other Ambulatory Visit: Payer: Self-pay

## 2023-04-10 ENCOUNTER — Other Ambulatory Visit: Payer: Self-pay

## 2023-04-23 ENCOUNTER — Other Ambulatory Visit: Payer: Self-pay

## 2023-04-30 ENCOUNTER — Other Ambulatory Visit (HOSPITAL_COMMUNITY): Payer: Self-pay

## 2023-04-30 DIAGNOSIS — H5213 Myopia, bilateral: Secondary | ICD-10-CM | POA: Diagnosis not present

## 2023-05-20 ENCOUNTER — Other Ambulatory Visit: Payer: Self-pay

## 2023-05-21 ENCOUNTER — Other Ambulatory Visit: Payer: Self-pay | Admitting: Oncology

## 2023-05-21 DIAGNOSIS — Z006 Encounter for examination for normal comparison and control in clinical research program: Secondary | ICD-10-CM

## 2023-05-28 ENCOUNTER — Other Ambulatory Visit: Payer: Self-pay

## 2023-06-17 ENCOUNTER — Other Ambulatory Visit: Payer: Self-pay

## 2023-07-08 ENCOUNTER — Other Ambulatory Visit (HOSPITAL_COMMUNITY): Payer: Self-pay

## 2023-07-21 ENCOUNTER — Ambulatory Visit (INDEPENDENT_AMBULATORY_CARE_PROVIDER_SITE_OTHER): Payer: Commercial Managed Care - PPO | Admitting: Family Medicine

## 2023-07-21 ENCOUNTER — Encounter: Payer: Self-pay | Admitting: Family Medicine

## 2023-07-21 ENCOUNTER — Other Ambulatory Visit: Payer: Self-pay

## 2023-07-21 VITALS — BP 100/68 | HR 52 | Temp 98.1°F | Ht 63.5 in | Wt 127.6 lb

## 2023-07-21 DIAGNOSIS — E039 Hypothyroidism, unspecified: Secondary | ICD-10-CM

## 2023-07-21 DIAGNOSIS — Z0000 Encounter for general adult medical examination without abnormal findings: Secondary | ICD-10-CM

## 2023-07-21 DIAGNOSIS — Z23 Encounter for immunization: Secondary | ICD-10-CM

## 2023-07-21 LAB — BASIC METABOLIC PANEL
BUN: 14 mg/dL (ref 6–23)
CO2: 28 meq/L (ref 19–32)
Calcium: 9.5 mg/dL (ref 8.4–10.5)
Chloride: 99 meq/L (ref 96–112)
Creatinine, Ser: 0.85 mg/dL (ref 0.40–1.20)
GFR: 76.55 mL/min (ref >60.00)
Glucose, Bld: 77 mg/dL (ref 70–99)
Potassium: 4 meq/L (ref 3.5–5.1)
Sodium: 135 meq/L (ref 135–145)

## 2023-07-21 LAB — HEMOGLOBIN A1C: Hgb A1c MFr Bld: 5.8 % (ref 4.6–6.5)

## 2023-07-21 LAB — CBC WITH DIFFERENTIAL/PLATELET
Basophils Absolute: 0.1 10*3/uL (ref 0.0–0.1)
Basophils Relative: 1.2 % (ref 0.0–3.0)
Eosinophils Absolute: 0.1 10*3/uL (ref 0.0–0.7)
Eosinophils Relative: 2.5 % (ref 0.0–5.0)
HCT: 42 % (ref 36.0–46.0)
Hemoglobin: 13.9 g/dL (ref 12.0–15.0)
Lymphocytes Relative: 25.9 % (ref 12.0–46.0)
Lymphs Abs: 1.5 10*3/uL (ref 0.7–4.0)
MCHC: 33.1 g/dL (ref 30.0–36.0)
MCV: 95.2 fl (ref 78.0–100.0)
Monocytes Absolute: 0.4 10*3/uL (ref 0.1–1.0)
Monocytes Relative: 7.5 % (ref 3.0–12.0)
Neutro Abs: 3.7 10*3/uL (ref 1.4–7.7)
Neutrophils Relative %: 62.9 % (ref 43.0–77.0)
Platelets: 273 10*3/uL (ref 150.0–400.0)
RBC: 4.41 Mil/uL (ref 3.87–5.11)
RDW: 13.3 % (ref 11.5–15.5)
WBC: 5.9 10*3/uL (ref 4.0–10.5)

## 2023-07-21 LAB — T3, FREE: T3, Free: 2.8 pg/mL (ref 2.3–4.2)

## 2023-07-21 LAB — HEPATIC FUNCTION PANEL
ALT: 30 U/L (ref 0–35)
AST: 30 U/L (ref 0–37)
Albumin: 4.4 g/dL (ref 3.5–5.2)
Alkaline Phosphatase: 77 U/L (ref 39–117)
Bilirubin, Direct: 0.1 mg/dL (ref 0.0–0.3)
Total Bilirubin: 0.6 mg/dL (ref 0.2–1.2)
Total Protein: 7.3 g/dL (ref 6.0–8.3)

## 2023-07-21 LAB — LIPID PANEL
Cholesterol: 207 mg/dL — ABNORMAL HIGH (ref 0–200)
HDL: 90.8 mg/dL (ref >39.00)
LDL Cholesterol: 99 mg/dL (ref 0–99)
NonHDL: 116.45
Total CHOL/HDL Ratio: 2
Triglycerides: 85 mg/dL (ref 0.0–149.0)
VLDL: 17 mg/dL (ref 0.0–40.0)

## 2023-07-21 LAB — T4, FREE: Free T4: 0.9 ng/dL (ref 0.60–1.60)

## 2023-07-21 LAB — VITAMIN D 25 HYDROXY (VIT D DEFICIENCY, FRACTURES): VITD: 35.12 ng/mL (ref 30.00–100.00)

## 2023-07-21 LAB — TSH: TSH: 0.64 u[IU]/mL (ref 0.35–5.50)

## 2023-07-21 MED ORDER — PROGESTERONE MICRONIZED 100 MG PO CAPS
100.0000 mg | ORAL_CAPSULE | Freq: Every day | ORAL | 3 refills | Status: DC
  Filled 2023-07-21 – 2023-08-21 (×2): qty 90, 90d supply, fill #0
  Filled 2023-11-23: qty 90, 90d supply, fill #1
  Filled 2024-02-17: qty 90, 90d supply, fill #2
  Filled 2024-05-17 – 2024-05-27 (×2): qty 90, 90d supply, fill #3

## 2023-07-21 MED ORDER — ESTRADIOL 1 MG PO TABS
1.0000 mg | ORAL_TABLET | Freq: Every day | ORAL | 3 refills | Status: DC
  Filled 2023-07-21: qty 90, 90d supply, fill #0

## 2023-07-21 MED ORDER — POTASSIUM CHLORIDE CRYS ER 20 MEQ PO TBCR
20.0000 meq | EXTENDED_RELEASE_TABLET | Freq: Every day | ORAL | 3 refills | Status: DC
  Filled 2023-07-21: qty 90, 90d supply, fill #0
  Filled 2023-10-15: qty 90, 90d supply, fill #1
  Filled 2024-01-13: qty 90, 90d supply, fill #2
  Filled 2024-04-13: qty 90, 90d supply, fill #3

## 2023-07-21 MED ORDER — LEVOTHYROXINE SODIUM 100 MCG PO TABS
100.0000 ug | ORAL_TABLET | Freq: Every day | ORAL | 3 refills | Status: DC
  Filled 2023-07-21 – 2023-09-24 (×2): qty 90, 90d supply, fill #0
  Filled 2023-12-18: qty 90, 90d supply, fill #1
  Filled 2024-03-30: qty 90, 90d supply, fill #2
  Filled 2024-06-24: qty 90, 90d supply, fill #3

## 2023-07-21 MED ORDER — ESCITALOPRAM OXALATE 10 MG PO TABS
10.0000 mg | ORAL_TABLET | Freq: Every day | ORAL | 3 refills | Status: DC
  Filled 2023-07-21 – 2023-09-24 (×2): qty 90, 90d supply, fill #0
  Filled 2023-12-18: qty 90, 90d supply, fill #1
  Filled 2024-03-30: qty 90, 90d supply, fill #2
  Filled 2024-06-24: qty 90, 90d supply, fill #3

## 2023-07-21 NOTE — Progress Notes (Signed)
Subjective:    Patient ID: Tamara Brewer, female    DOB: 19-Jun-1966, 57 y.o.   MRN: [id number]  HPI Here for a well exam. She is doing well, but she asks about her dose of estrogen. She has noticed her hair thinning some over the past few months, and she knows this could be related to her thyroid levels. She denies any hot flashes, but she has been on the Estradiol patch for about 5 years, and she is interested in changing this.    Review of Systems  Constitutional: Negative.   HENT: Negative.    Eyes: Negative.   Respiratory: Negative.    Cardiovascular: Negative.   Gastrointestinal: Negative.   Genitourinary:  Negative for decreased urine volume, difficulty urinating, dyspareunia, dysuria, enuresis, flank pain, frequency, hematuria, pelvic pain and urgency.  Musculoskeletal: Negative.   Skin: Negative.   Neurological: Negative.  Negative for headaches.  Psychiatric/Behavioral: Negative.         Objective:   Physical Exam Constitutional:      General: She is not in acute distress.    Appearance: Normal appearance. She is well-developed.  HENT:     Head: Normocephalic and atraumatic.     Right Ear: External ear normal.     Left Ear: External ear normal.     Nose: Nose normal.     Mouth/Throat:     Pharynx: No oropharyngeal exudate.  Eyes:     General: No scleral icterus.    Conjunctiva/sclera: Conjunctivae normal.     Pupils: Pupils are equal, round, and reactive to light.  Neck:     Thyroid: No thyromegaly.     Vascular: No JVD.  Cardiovascular:     Rate and Rhythm: Normal rate and regular rhythm.     Pulses: Normal pulses.     Heart sounds: Normal heart sounds. No murmur heard.    No friction rub. No gallop.  Pulmonary:     Effort: Pulmonary effort is normal. No respiratory distress.     Breath sounds: Normal breath sounds. No wheezing or rales.  Chest:     Chest wall: No tenderness.  Abdominal:     General: Bowel sounds are normal. There is no distension.      Palpations: Abdomen is soft. There is no mass.     Tenderness: There is no abdominal tenderness. There is no guarding or rebound.  Musculoskeletal:        General: No tenderness. Normal range of motion.     Cervical back: Normal range of motion and neck supple.  Lymphadenopathy:     Cervical: No cervical adenopathy.  Skin:    General: Skin is warm and dry.     Findings: No erythema or rash.  Neurological:     General: No focal deficit present.     Mental Status: She is alert and oriented to person, place, and time.     Cranial Nerves: No cranial nerve deficit.     Motor: No abnormal muscle tone.     Coordination: Coordination normal.     Deep Tendon Reflexes: Reflexes are normal and symmetric. Reflexes normal.  Psychiatric:        Mood and Affect: Mood normal.        Behavior: Behavior normal.        Thought Content: Thought content normal.        Judgment: Judgment normal.           Assessment & Plan:  Well exam. We discussed diet  and exercise. Get fasting labs. We will change from Estradiol patches to Estradiol 1 mg pills daily.  Gershon Crane, MD

## 2023-07-22 ENCOUNTER — Other Ambulatory Visit (HOSPITAL_COMMUNITY): Payer: Self-pay

## 2023-08-21 ENCOUNTER — Other Ambulatory Visit (HOSPITAL_COMMUNITY): Payer: Self-pay

## 2023-09-18 IMAGING — MG MM DIGITAL SCREENING BILAT W/ TOMO AND CAD
8 series · 9 of 24 positions shown · non-contrast
Comparison: Previous exam(s).

CLINICAL DATA: Screening.

EXAM:
DIGITAL SCREENING BILATERAL MAMMOGRAM WITH TOMOSYNTHESIS AND CAD
TECHNIQUE: Bilateral screening digital craniocaudal and mediolateral oblique
mammograms were obtained. Bilateral screening digital breast
tomosynthesis was performed. The images were evaluated with
computer-aided detection.

[R CC synth-2D]
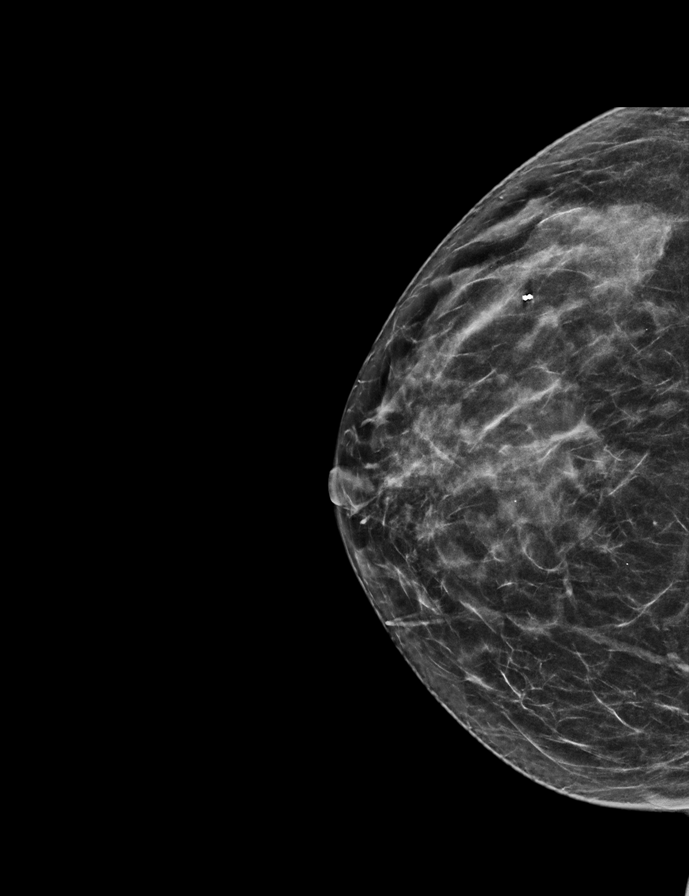

[L CC synth-2D]
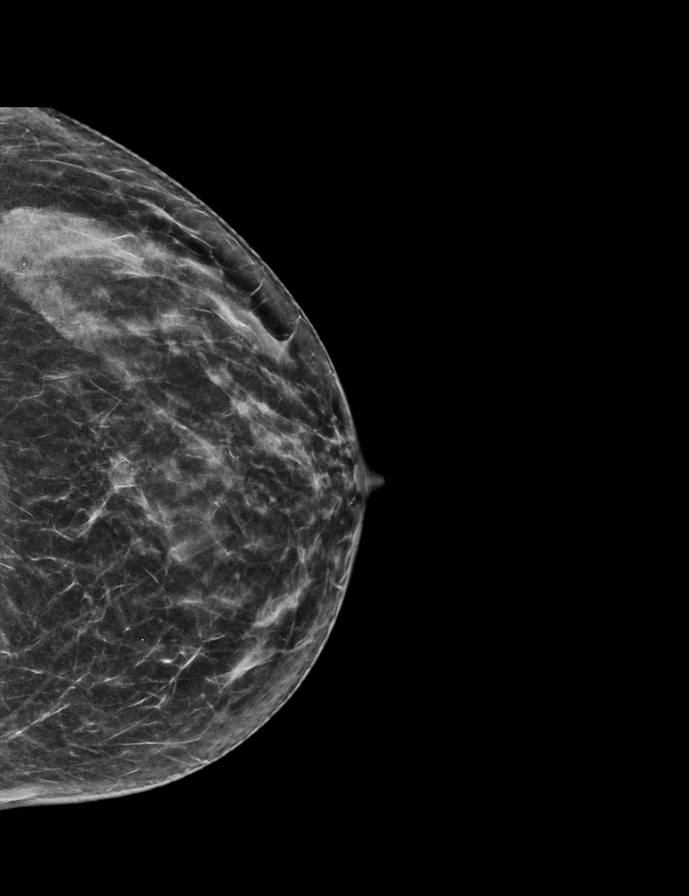

[L MLO synth-2D]
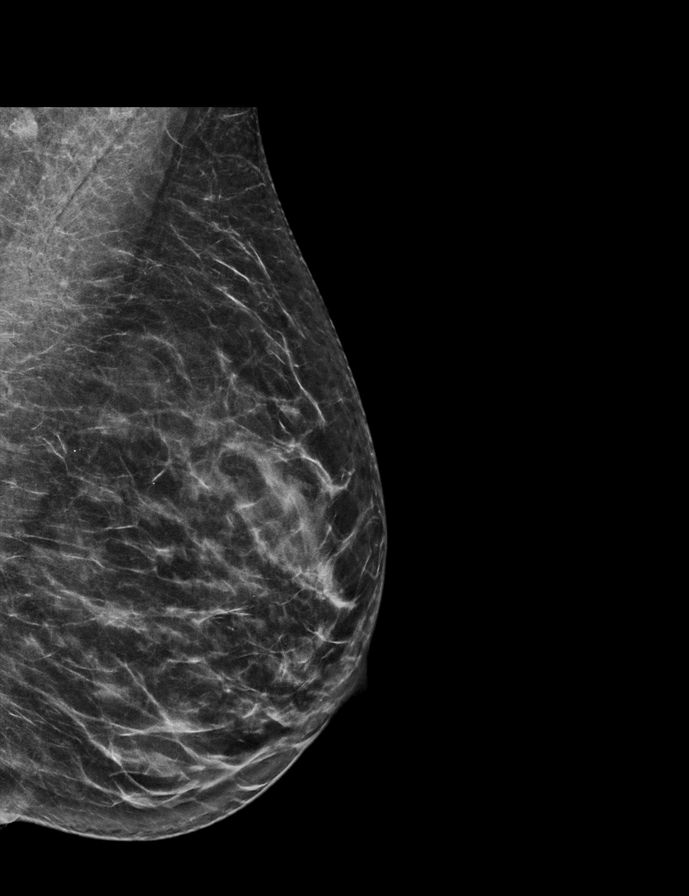

[R MLO synth-2D]
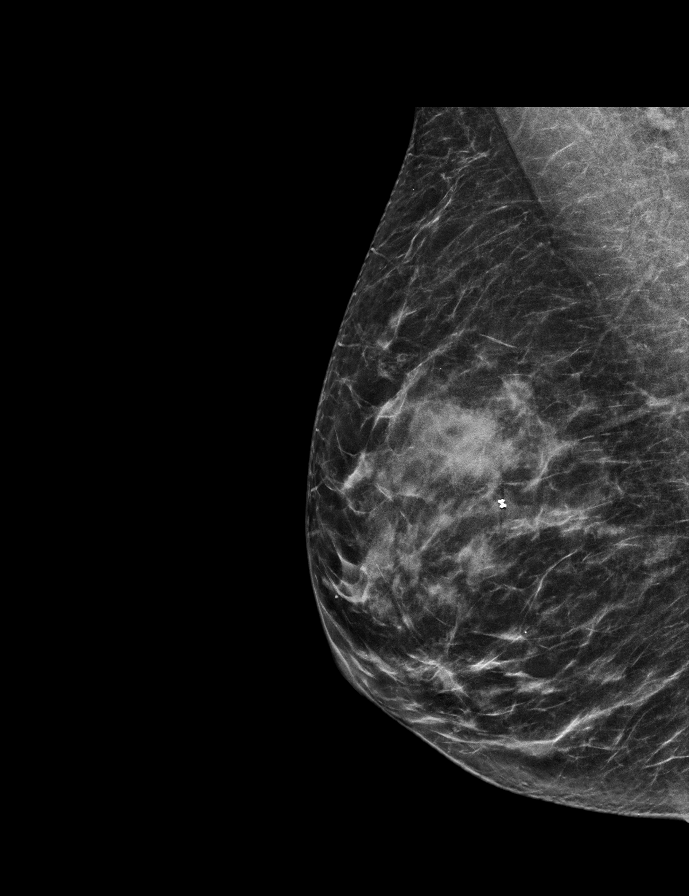

[R CC tomo · 2 of 58 frames shown]
[frame 19/58]
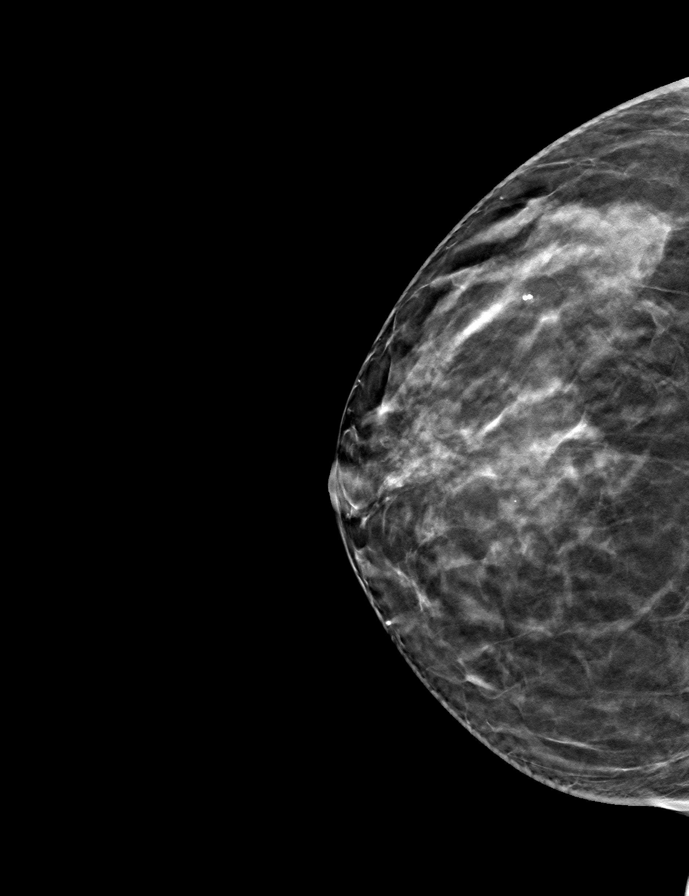
[frame 29/58]
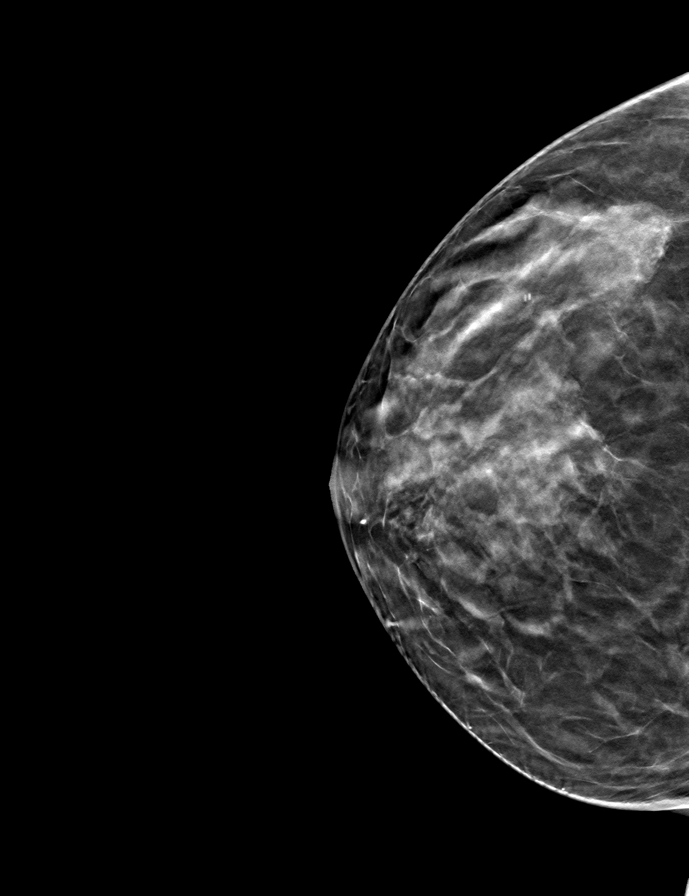

[L CC tomo · tomo slice 29/58.0]
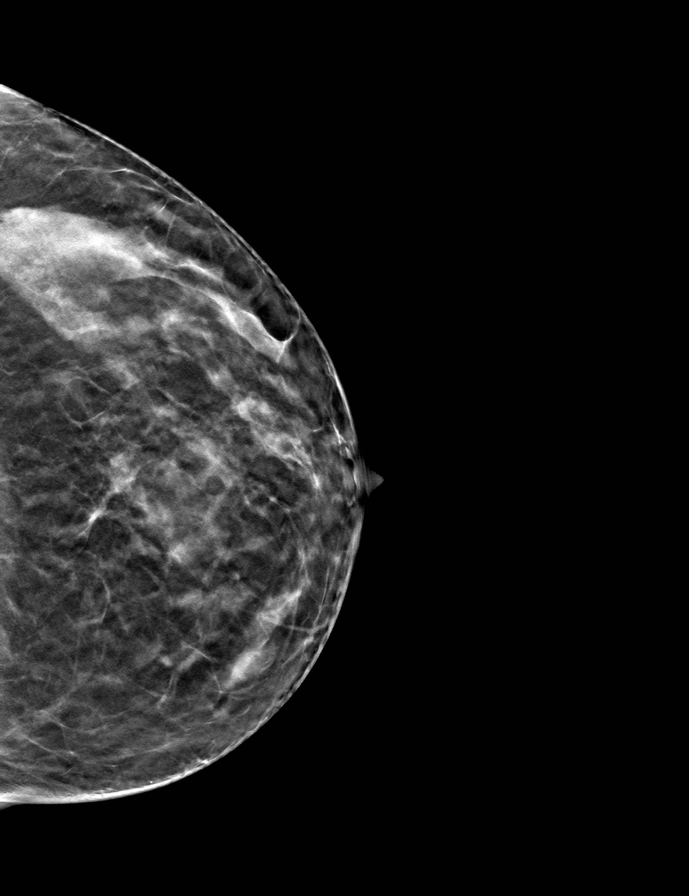

[L MLO tomo · tomo slice 31/61.0]
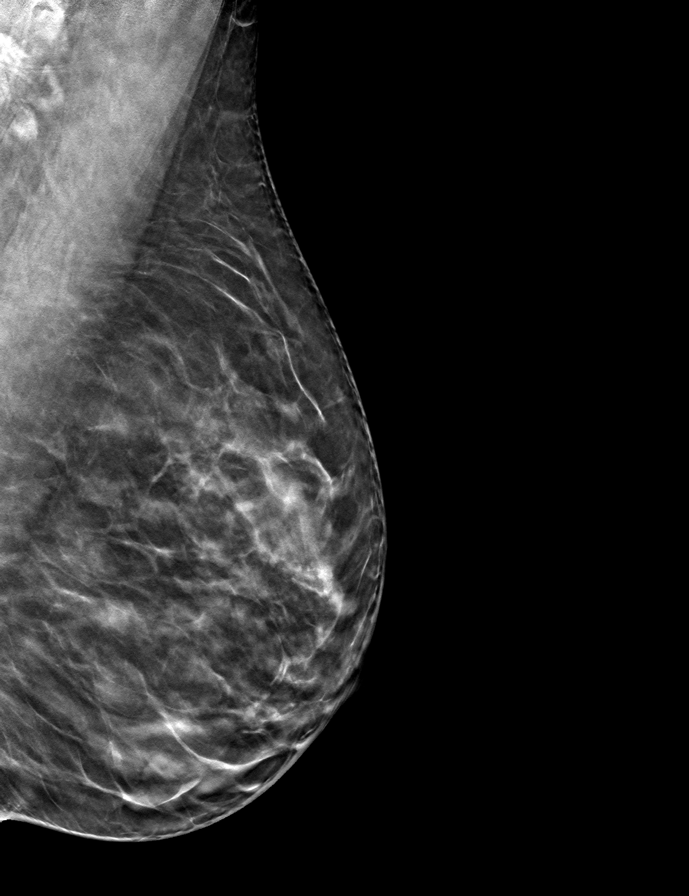

[R MLO tomo · tomo slice 29/58.0]
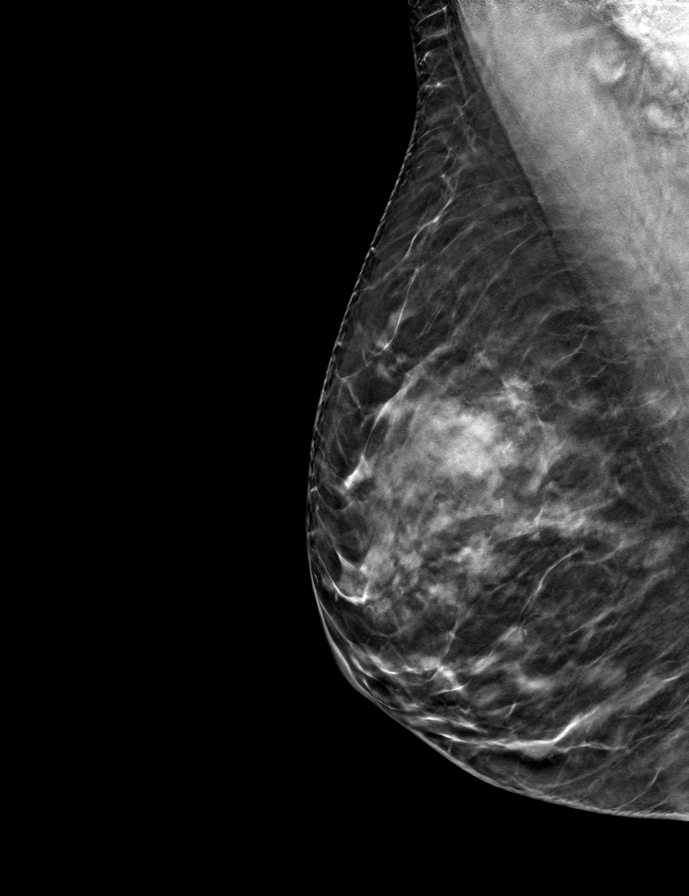

[9 of 24 positions shown; findings below may reference images not displayed]

ACR Breast Density Category c: The breast tissue is heterogeneously
dense, which may obscure small masses.
FINDINGS: There are no findings suspicious for malignancy.
IMPRESSION: No mammographic evidence of malignancy. A result letter of this
screening mammogram will be mailed directly to the patient.

RECOMMENDATION:
Screening mammogram in one year. (Code:Q3-W-BC3)

BI-RADS CATEGORY  1: Negative.

## 2023-09-24 ENCOUNTER — Encounter: Payer: Self-pay | Admitting: Family Medicine

## 2023-09-24 ENCOUNTER — Other Ambulatory Visit (HOSPITAL_COMMUNITY): Payer: Self-pay

## 2023-09-24 ENCOUNTER — Other Ambulatory Visit: Payer: Self-pay

## 2023-09-26 ENCOUNTER — Other Ambulatory Visit: Payer: Self-pay

## 2023-09-26 ENCOUNTER — Other Ambulatory Visit (HOSPITAL_COMMUNITY): Payer: Self-pay

## 2023-09-26 MED ORDER — ESTRADIOL 0.1 MG/24HR TD PTTW
1.0000 | MEDICATED_PATCH | TRANSDERMAL | 12 refills | Status: DC
  Filled 2023-09-26: qty 8, 28d supply, fill #0
  Filled 2023-10-30: qty 8, 28d supply, fill #1
  Filled 2023-11-23: qty 8, 28d supply, fill #2
  Filled 2023-12-21: qty 8, 28d supply, fill #3
  Filled 2024-07-05: qty 8, 28d supply, fill #4

## 2023-09-26 NOTE — Telephone Encounter (Signed)
Done

## 2023-10-15 ENCOUNTER — Other Ambulatory Visit (HOSPITAL_COMMUNITY): Payer: Self-pay

## 2023-10-31 ENCOUNTER — Other Ambulatory Visit (HOSPITAL_COMMUNITY): Payer: Self-pay

## 2023-11-24 ENCOUNTER — Other Ambulatory Visit: Payer: Self-pay

## 2023-11-24 ENCOUNTER — Other Ambulatory Visit: Payer: Self-pay | Admitting: Family Medicine

## 2023-11-24 DIAGNOSIS — Z1231 Encounter for screening mammogram for malignant neoplasm of breast: Secondary | ICD-10-CM

## 2023-11-25 ENCOUNTER — Ambulatory Visit
Admission: RE | Admit: 2023-11-25 | Discharge: 2023-11-25 | Discharge status: Home / Self Care | Payer: Commercial Managed Care - PPO | Source: Ambulatory Visit | Attending: Family Medicine | Admitting: Family Medicine

## 2023-11-25 DIAGNOSIS — Z1231 Encounter for screening mammogram for malignant neoplasm of breast: Secondary | ICD-10-CM | POA: Diagnosis not present

## 2023-12-18 ENCOUNTER — Other Ambulatory Visit: Payer: Self-pay

## 2023-12-21 ENCOUNTER — Other Ambulatory Visit: Payer: Self-pay

## 2024-01-02 ENCOUNTER — Ambulatory Visit: Payer: Commercial Managed Care - PPO | Admitting: Psychology

## 2024-01-02 DIAGNOSIS — F419 Anxiety disorder, unspecified: Secondary | ICD-10-CM | POA: Diagnosis not present

## 2024-01-02 NOTE — Progress Notes (Signed)
 Tamara Brewer Initial Adult Exam  Name: Tamara Brewer Date: 01/02/2024 MRN: [id number] DOB: December 03, 1965 PCP: Tamara Salisbury, MD  Time Spent: 3:00 pm - 3:55 pm: 55 Minutes  Guardian/Payee:  Self    Paperwork requested: No   Reason for Visit /Presenting Problem: Relationship problems.  Mental Status Exam: Appearance:   Well Groomed     Behavior:  Appropriate  Motor:  Normal  Speech/Language:   Normal Rate  Affect:  Appropriate  Mood:  normal  Thought process:  normal  Thought content:    WNL  Sensory/Perceptual disturbances:    WNL  Orientation:  oriented to person, place, and time/date  Attention:  Good  Concentration:  Good and Fair  Memory:  WNL  Fund of knowledge:   Good  Insight:    Good  Judgment:   Good  Impulse Control:  Good   Reported Symptoms:  Worrying about things, feeling nervous, trouble relaxing, becoming easily annoyed or irritable, feelng down, having little energy, trouble concentrating.   Risk Assessment: Danger to Self:  No Self-injurious Behavior: No Danger to Others: No Duty to Warn:no Physical Aggression / Violence:No  Access to Firearms a concern: No  Gang Involvement:No  Patient / guardian was educated about steps to take if suicide or homicide risk level increases between visits: yes While future psychiatric events cannot be accurately predicted, the patient does not currently require acute inpatient psychiatric care and does not currently meet [facility] involuntary commitment criteria.  Substance Abuse History: Current substance abuse: No     Caffeine: N/A Tobacco: N/A Alcohol: two drinks per week Substance use: N/A  Past Psychiatric History:   No previous psychological problems have been observed Outpatient Providers:N/A History of Psych Hospitalization: No  Psychological Testing:  No    Abuse History:  Victim of abuse: patient declined to answer  Report needed: No. Victim of Neglect:No. Perpetrator  of  No   Witness / Exposure to Domestic Violence: declined to answer Protective Services Involvement: No  Witness to [employer] Violence:  No   Family History:  Family History  Problem Relation Age of Onset   Breast cancer Maternal Grandmother    Hyperlipidemia Maternal Grandfather    Diabetes Maternal Grandfather    Breast cancer Maternal Aunt    Breast cancer Other    Colon cancer Neg Hx    Colon polyps Neg Hx    Esophageal cancer Neg Hx    Rectal cancer Neg Hx    Stomach cancer Neg Hx     Living situation: the patient lives with their family  Sexual Orientation: Straight  Relationship Status: married  Name of spouse / other: Tamara Brewer If a parent, number of children / ages: 52 year old daughter Tamara Brewer, Tamara Brewer 41 year old son.  Support Systems: sister, Tamara Brewer, in [place], sister Tamara Brewer, in [place], bro. Tamara Brewer, lives w parents. Ipt is the oldest) has good friends  Surveyor, quantity Stress:  Yes   Income/Employment/Disability: Employment  Financial planner:  No  Educational History: Education: Brewer Engineer, maintenance (IT) work or Engineer, technical sales: I believe in God.   Any cultural differences that may affect / interfere with treatment:  not applicable   Recreation/Hobbies: Paddleboard, work out at home, spending time with friends,   Stressors: Marital or family conflict   husband recovering alcoholic, financial issues,   Strengths: Persistent, independence, exercise, friendships, sisters  Barriers:  N/A  Legal History: Pending legal issue / charges: The patient has no significant history of legal issues.  History of legal issue / charges:  N/A  Medical History/Surgical History: not reviewed Past Medical History:  Diagnosis Date   Allergy    Bulimia    Hyperlipidemia    Hypokalemia    Hypothyroidism    Uterine polyp     Past Surgical History:  Procedure Laterality Date   BREAST BIOPSY     BUNIONECTOMY  1994   right foot    COLONOSCOPY  10/13/2017   per Dr.  Lavon Paganini, benign polyp, repeat in 10 yrs    ENDOMETRIAL ABLATION  2009   per Dr. Candice Camp    LIPOMA EXCISION     from right axilla    MANDIBLE RECONSTRUCTION  1994    Medications: Current Outpatient Medications  Medication Sig Dispense Refill   clobetasol cream (TEMOVATE) 0.05 % Apply topically twice daily as needed. 30 g 2   escitalopram (LEXAPRO) 10 MG tablet Take 1 tablet (10 mg total) by mouth daily. 90 tablet 3   estradiol (VIVELLE-DOT) 0.1 MG/24HR patch Place 1 patch (0.1 mg total) onto the skin 2 (two) times a week. 8 patch 12   levothyroxine (SYNTHROID) 100 MCG tablet Take 1 tablet (100 mcg total) by mouth daily. 90 tablet 3   Multiple Vitamin (MULTI-VITAMIN PO) Take by mouth.     Omega-3 Fatty Acids (FISH OIL PO) Take by mouth.     potassium chloride SA (KLOR-CON M) 20 MEQ tablet Take 1 tablet (20 mEq total) by mouth daily. 90 tablet 3   progesterone (PROMETRIUM) 100 MG capsule Take 1 capsule (100 mg total) by mouth at bedtime. 90 capsule 3   No current facility-administered medications for this visit.    No Known Allergies  Diagnoses:  Anxiety  Psychiatric Treatment: Yes , Talked to Brewer before Covid  Plan of Care: OPT  Narrative:  Tamara Brewer participated from office with therapist and consented to treatment. We reviewed the limits of confidentiality prior to the start of the evaluation. Tamara Brewer expressed understanding and agreement to proceed.   Patient is a 58 year old female who presented for an initial assessment. Patient reported the following symptoms: Worrying about things, feeling nervous, trouble relaxing, becoming easily annoyed or irritable, feelng down, having little energy, trouble concentrating. Patient denied current and past suicidal ideation, homicidal ideation, and symptoms of psychosis. Patient denied current or past tobacco or drug use. Patient reported no history of outpatient or inpatient psychiatric treatment. Patient reported  current stressors as relationship problems. Patient identified current supports as her friends.  In 2008 patient's marital problems worsened around financial issues and alcohol issues. Patient's husband has been sober for two years, but additional relationship issues remain. Patient has an appointment with an accountant next week, Friday afternoon. It is recommended that patient participate in individual therapy weekly/biweekly. A follow-up was scheduled to create a treatment plan and begin treatment. Therapist answered  and all questions during the evaluation and contact information was provided.    Helyn Numbers

## 2024-01-06 DIAGNOSIS — Z79890 Hormone replacement therapy (postmenopausal): Secondary | ICD-10-CM | POA: Diagnosis not present

## 2024-01-06 DIAGNOSIS — N941 Unspecified dyspareunia: Secondary | ICD-10-CM | POA: Diagnosis not present

## 2024-01-06 DIAGNOSIS — Z01419 Encounter for gynecological examination (general) (routine) without abnormal findings: Secondary | ICD-10-CM | POA: Diagnosis not present

## 2024-01-06 DIAGNOSIS — Z6823 Body mass index (BMI) 23.0-23.9, adult: Secondary | ICD-10-CM | POA: Diagnosis not present

## 2024-01-07 ENCOUNTER — Other Ambulatory Visit (HOSPITAL_COMMUNITY): Payer: Self-pay

## 2024-01-07 ENCOUNTER — Other Ambulatory Visit: Payer: Self-pay

## 2024-01-07 MED ORDER — ESTRADIOL 0.1 MG/GM VA CREA
TOPICAL_CREAM | VAGINAL | 3 refills | Status: DC
  Filled 2024-01-07: qty 42.5, 90d supply, fill #0
  Filled 2024-04-01: qty 42.5, 90d supply, fill #1
  Filled 2024-06-30: qty 42.5, 90d supply, fill #2

## 2024-01-07 MED ORDER — PROGESTERONE MICRONIZED 100 MG PO CAPS
100.0000 mg | ORAL_CAPSULE | Freq: Every day | ORAL | 3 refills | Status: AC
  Filled 2024-01-07 – 2024-08-21 (×2): qty 90, 90d supply, fill #0
  Filled 2024-11-17: qty 90, 90d supply, fill #1

## 2024-01-07 MED ORDER — ESTRADIOL 0.1 MG/24HR TD PTTW
1.0000 | MEDICATED_PATCH | TRANSDERMAL | 3 refills | Status: AC
  Filled 2024-01-07: qty 24, 84d supply, fill #0
  Filled 2024-04-06: qty 24, 84d supply, fill #1
  Filled 2024-07-29: qty 24, 84d supply, fill #2
  Filled 2024-10-18: qty 24, 84d supply, fill #3

## 2024-01-13 ENCOUNTER — Other Ambulatory Visit (HOSPITAL_COMMUNITY): Payer: Self-pay

## 2024-01-14 ENCOUNTER — Other Ambulatory Visit: Payer: Self-pay

## 2024-01-15 ENCOUNTER — Ambulatory Visit (INDEPENDENT_AMBULATORY_CARE_PROVIDER_SITE_OTHER): Payer: Commercial Managed Care - PPO | Admitting: Psychology

## 2024-01-15 DIAGNOSIS — F419 Anxiety disorder, unspecified: Secondary | ICD-10-CM | POA: Diagnosis not present

## 2024-01-15 NOTE — Progress Notes (Signed)
 [facility] Counselor/Therapist Progress Note/TX PLAN  Patient ID: Tamara Brewer, MRN: [id number]    Date: 01/15/2024  Time Spent: 6:00 pm - 6:53 pm : 53 minutes   Treatment Type: Individual Therapy.  Reported Symptoms: Worrying about things, feeling nervous, trouble relaxing, becoming easily annoyed or irritable, feelng down, having little energy, trouble concentrating.  Mental Status Exam: Appearance:  Well Groomed     Behavior: Appropriate  Motor: Normal  Speech/Language:  Normal Rate  Affect: Appropriate  Mood: normal  Thought process: normal  Thought content:   WNL  Sensory/Perceptual disturbances:   WNL  Orientation: oriented to person, place, and time/date  Attention: Good  Concentration: Good  Memory: WNL  Fund of knowledge:  Good  Insight:   Good  Judgment:  Good  Impulse Control: Good   Risk Assessment: Danger to Self:  No Self-injurious Behavior: No Danger to Others: No Duty to Warn:no Physical Aggression / Violence:No  Access to Firearms a concern: No  Gang Involvement:No   Subjective:   Tamara Brewer participated in the session, in person in the office with the therapist, and consented to treatment Tamara Brewer reviewed the events of the past week.   Patient had been thinking about "is there something for [place] to continue with (patient and her husband?)" Patient and her husband were having a discussion with a heated tone, Patient's daughter came in the living room and announced you guys aren't happy, just get a divorce. Patient told her daughter, that she doesn't understand what is going on, she (daughter) doesn't understand. Patient has a patient Bonita Quin who is dying from CA and was tearful talking about, Sometimes I have a sense of people, went to the funeral of a past patient. Not next week March 14-22 due to traveling with daughter.   We reviewed numerous treatment approaches including CBT, BA, Problem Solving, and Solution focused therapy.  Psych-education regarding the Tamara Brewer diagnosis of Anxiety disorder, unspecified type [F41.9] was provided during the session. We discussed Tamara Brewer goals treatment goals which include finding peace, being content, finding happiness, have an even keel, be content at home, enjoy life, be less stuck, get house in order to be sold, mentally keep mind set to go forward. Tamara Brewer provided verbal approval of the treatment plan.   Interventions: Psycho-education & Goal Setting.   Diagnosis:   Anxiety disorder, unspecified type [F41.9]    Psychiatric Treatment: Yes , talked to counselor before Covid.  Treatment Plan:  Client Abilities/Strengths Ginna intelligent, motivated for change, self aware, committed to creating a happy and peaceful life for herself and her adult children.   Support System: Sister, Tamara Brewer, in [place], sister [place], in [place], bro. Tamara Brewer, lives w parents, and patient has good friends   Hospital doctor Preferences OPT  Theatre manager of Needs Tamara Brewer would like to find peace and happiness, be content at home, enjoy life, get house in order to be sold and mentally keep mind set to go forward.    Treatment Level Weekly/Biweekly  Symptoms  Anxiety: worrying about things, feeling nervous, trouble relaxing, becoming easily annoyed or irritable,  Depression: feeling down, having little energy, trouble   Goals:   Tamara Brewer experiences symptoms of anxiety  Treatment plan signed and available on s-drive:  Yes    Target Date: 01/01/25 Frequency: Weekly/Biweekly  Progress: 0 Modality: individual    Therapist will provide referrals for additional resources as appropriate.  Therapist will provide psycho-education regarding Tamara Brewer diagnosis and corresponding treatment approaches and  interventions. Tamara Brewer will support the patient's ability to achieve the goals identified. will employ CBT, BA, Problem-solving, Solution Focused, Mindfulness,  coping skills,  & other evidenced-based practices will be used to promote progress towards healthy functioning to help manage decrease symptoms associated with their diagnosis.   Reduce overall level, frequency, and intensity of the feelings of depression, anxiety and panic evidenced by decreased overall symptoms from 6 to 7 days/week to 0 to 1 days/week per client report for at least 3 consecutive months. Verbally express understanding of the relationship between feelings of anxiety and its impact on thinking patterns and behaviors. Verbalize an understanding of the role that distorted thinking plays in creating fears, excessive worry, and ruminations.  Elnita Maxwell participated in the creation of the treatment plan)     Tamara Brewer

## 2024-02-04 DIAGNOSIS — R102 Pelvic and perineal pain: Secondary | ICD-10-CM | POA: Diagnosis not present

## 2024-02-09 ENCOUNTER — Ambulatory Visit (INDEPENDENT_AMBULATORY_CARE_PROVIDER_SITE_OTHER): Admitting: Psychology

## 2024-02-09 DIAGNOSIS — F419 Anxiety disorder, unspecified: Secondary | ICD-10-CM

## 2024-02-09 NOTE — Progress Notes (Signed)
 [facility] Counselor/Therapist Progress Note  Patient ID: AUBREIGH FUERTE, MRN: [id number]    Date: 02/09/24  Time Spent: 5:00 pm - 5:59 pm : 59 minutes  Treatment Type: Individual Therapy.  Reported Symptoms: Worrying about things, feeling nervous, trouble relaxing, becoming easily annoyed or irritable, feelng down, having little energy, trouble concentrating.   Mental Status Exam: Appearance:  Well Groomed     Behavior: Appropriate  Motor: Normal  Speech/Language:  Normal Rate  Affect: Appropriate  Mood: normal  Thought process: normal  Thought content:   WNL  Sensory/Perceptual disturbances:   WNL  Orientation: oriented to person, place, and time/date  Attention: Good  Concentration: Good  Memory: WNL  Fund of knowledge:  Good  Insight:   Good  Judgment:  Good  Impulse Control: Good   Risk Assessment: Danger to Self:  No Self-injurious Behavior: No Danger to Others: No Duty to Warn:no Physical Aggression / Violence:No  Access to Firearms a concern: No  Gang Involvement:No   Subjective:   [name] participated from office, via video, and consented to treatment. I discussed the limitations of evaluation and management by telemedicine and the availability of in person appointments. The patient expressed understanding and agreed to proceed.  Therapist participated from office. Alexei reviewed the events of the past week.   Patient returned from being out of town with daughter visiting patient's parents in [place]. At the last minute, patient also invited her friend and her friend's daughter who only has a few months left to live. Patient discussed the grieving process that she is going through, and how she is busy taking care of everyone else right now. We reviewed each of the goals she shared during her last session three weeks ago. The goal patient committed to working on over the next two weeks was to journal her thoughts about specific steps involved in  her thought process going forward.   Interventions: Cognitive Behavioral Therapy  Diagnosis: Anxiety disorder, unspecified type [F41.9]   Anxiety: worrying about things, feeling nervous, trouble relaxing, becoming easily annoyed or irritable,  Depression: feeling down, having little energy, trouble  Psychiatric Treatment: Yes , talked to counselor before Covid  Treatment Plan:  Client Abilities/Strengths Maeven is intelligent, motivated for change, self aware, committed to creating a happy and peaceful life for herself and her adult children.   Support System: Lebron Conners, in [place], sister [place], in [place], bro. Doristine Mango, lives w parents, and patient has good friend.  Client Treatment Preferences OPT  Client Statement of Needs Sahmya would like to find peace and happiness, be content at home, enjoy life, get house in order to be sold and mentally keep mind set to go forward.     Treatment Level Weekly  Symptoms  Anxiety: worrying about things, feeling nervous, trouble relaxing, becoming easily annoyed or irritable,  Depression: feeling down, having little energy, trouble sleeping  Goals:   Kaeley experiences symptoms of anxiety   Target Date: 01/01/25 Frequency: Weekly/Biweekly  Progress: 0 Modality: individual    Therapist will provide referrals for additional resources as appropriate.  Therapist will provide psycho-education regarding Jara's diagnosis and corresponding treatment approaches and interventions. Helyn Numbers will support the patient's ability to achieve the goals identified. will employ CBT, BA, Problem-solving, Solution Focused, Mindfulness,  coping skills, & other evidenced-based practices will be used to promote progress towards healthy functioning to help manage decrease symptoms associated with their diagnosis.   Reduce overall level, frequency, and intensity of the feelings  of depression, anxiety and panic evidenced by decreased overall symptoms from  6 to 7 days/week to 0 to 1 days/week per client report for at least 3 consecutive months. Verbally express understanding of the relationship between feelings of anxiety and their impact on thinking patterns and behaviors. Verbalize an understanding of the role that distorted thinking plays in creating fears, excessive worry, and ruminations.  Elnita Maxwell participated in the creation of the treatment plan)    Helyn Numbers

## 2024-02-17 ENCOUNTER — Other Ambulatory Visit (HOSPITAL_COMMUNITY): Payer: Self-pay

## 2024-02-23 ENCOUNTER — Ambulatory Visit (INDEPENDENT_AMBULATORY_CARE_PROVIDER_SITE_OTHER): Admitting: Psychology

## 2024-02-23 DIAGNOSIS — F419 Anxiety disorder, unspecified: Secondary | ICD-10-CM | POA: Diagnosis not present

## 2024-02-23 NOTE — Progress Notes (Signed)
 [facility] Counselor/Therapist Progress Note  Patient ID: Tamara Brewer, MRN: [id number]    Date: 02/23/24  Time Spent: 5:00 pm  - 5:57 :  57 minutes  Treatment Type: Individual Therapy.  Reported Symptoms: Worrying about things, feeling nervous, trouble relaxing, becoming easily annoyed or irritable, feelng down, having little energy, trouble concentrating.   Mental Status Exam  Appearance:  Well Groomed     Behavior: Appropriate  Motor: Normal  Speech/Language:  Normal Rate  Affect: Appropriate  Mood: normal  Thought process: normal  Thought content:   WNL  Sensory/Perceptual disturbances:   WNL  Orientation: oriented to person, place, and time/date  Attention: Good  Concentration: Good  Memory: WNL  Fund of knowledge:  Good  Insight:   Good  Judgment:  Good  Impulse Control: Good   Risk Assessment: Danger to Self:  No Self-injurious Behavior: No Danger to Others: No Duty to Warn:no Physical Aggression / Violence:No  Access to Firearms a concern: No  Gang Involvement:No   Subjective:   Tamara Brewer participated from office, via video, and consented to treatment. I discussed the limitations of evaluation and management by telemedicine and the availability of in person appointments. The patient expressed understanding and agreed to proceed.  Therapist participated from office.  Tamara Brewer reviewed the events of the past week.   Patient and her husband had a discussion about their marriage. Patient's husband continues to not take accountability for his role in their marital problems. Patient vacillates between leaving vs staying in the marriage. Patient reported family dynamics that may play a role in what she is going through right now.   Interventions: Cognitive Behavioral Therapy  Diagnosis:  Anxiety disorder, unspecified type [F41.9]   Psychiatric Treatment: Yes , spoke with a counselor during Covid  Treatment Plan:  Client  Abilities/Strengths Tamara Brewer is intelligent, motivated for change, self aware, committed to creating a happy and peaceful life for herself and her adult children.   Support System: [name], in [place], sister [place], in [place], bro. [name], lives w parents, and patient has good friend.   Client Treatment Preferences OPT  Client Statement of Needs Tamara Brewer would like to find peace and happiness, be content at home, enjoy life, get house in order to be sold and mentally keep mind set to go forward.   Treatment Level     Symptoms  Anxiety: worrying about things, feeling nervous, trouble relaxing, becoming easily annoyed or irritable (Status: maintained),  Depression: feeling down, having little energy, trouble sleeping (Status: maintained)  Goals:   Monika experiences symptoms of Anxiety   Target Date: 01/01/25 Frequency: Weekly/Biweekly  Progress: 0 Modality: individual    Therapist will provide referrals for additional resources as appropriate.  Therapist will provide psycho-education regarding Adline's diagnosis and corresponding treatment approaches and interventions. [name] will support the patient's ability to achieve the goals identified. will employ CBT, BA, Problem-solving, Solution Focused, Mindfulness,  coping skills, & other evidenced-based practices will be used to promote progress towards healthy functioning to help manage decrease symptoms associated with their diagnosis.   Reduce overall level, frequency, and intensity of the feelings of depression, anxiety and panic evidenced by decreased overall symptoms from 6 to 7 days/week to 0 to 1 days/week per client report for at least 3 consecutive months. Verbally express understanding of the relationship between feelings of anxiety and its impact on thinking patterns and behaviors. Verbalize an understanding of the role that distorted thinking plays in creating fears, excessive worry,  and ruminations.  Tamara Brewer  participated in the creation of the treatment plan)    [name]

## 2024-03-11 ENCOUNTER — Ambulatory Visit: Admitting: Psychology

## 2024-03-11 DIAGNOSIS — F419 Anxiety disorder, unspecified: Secondary | ICD-10-CM | POA: Diagnosis not present

## 2024-03-11 NOTE — Progress Notes (Signed)
  Behavioral Health Counselor/Therapist Progress Note  Patient ID: Tamara Brewer, MRN: [id number]    Date: 03/11/24  Time Spent: 6:00 pm - 6:53 pm : 53  minutes    Treatment Type: Individual Therapy.  Reported Symptoms: Worrying about things, feeling nervous, trouble relaxing, becoming easily annoyed or irritable, feelng down, having little energy, trouble concentrating.   Mental Status Exam: Appearance:  Well Groomed     Behavior: Appropriate  Motor: Normal  Speech/Language:  Normal Rate  Affect: Appropriate  Mood: normal  Thought process: normal  Thought content:   WNL  Sensory/Perceptual disturbances:   WNL  Orientation: oriented to person, place, and time/date  Attention: Good  Concentration: Good  Memory: WNL  Fund of knowledge:  Good  Insight:   Good  Judgment:  Good  Impulse Control: Good   Risk Assessment: Danger to Self:  No Self-injurious Behavior: No Danger to Others: No Duty to Warn:no Physical Aggression / Violence:No  Access to Firearms a concern: No  Gang Involvement:No   Subjective:   Tamara Brewer participated from home, via video, and consented to treatment. I discussed the limitations of evaluation and management by telemedicine and the availability of in person appointments. The patient expressed understanding and agreed to proceed.  Therapist participated from home office.  Tamara Brewer reviewed the events of the past week.   Patient and her husband talked this weekend about their marriage and they have decided to get a divorce. Patient had been "on the fence" about whether to stay in the marriage, and then due to an incident with her husband's business, a Financial trader, and a Education officer, community.  Go forward with the divorce is a goal to be added, finding peace, being content, finding happiness, have an even keel, be content at home, enjoy life, be less stuck, get house in order to be sold, mentally keep mind set to go forward.   Patient felt relieved and  ready to have a discussion with her husband about him moving out and patient moving quickly to sell the house. Waiting for daughter to hear if she got the new job-new job means she'll be moving out.   Interventions: Cognitive Behavioral Therapy  Diagnosis:  Anxiety disorder, unspecified type [F41.9]   Psychiatric Treatment: Yes , spoke with a counselor in the past but doesn't remember who.  Treatment Plan:  Client Abilities/Strengths Tamara Brewer is intelligent, motivated for change, self aware, committed to creating a happy and peaceful life for herself and her adult children.   Support System: Tamara Brewer, in [place], sister Tamara Brewer, in [place], bro. Tamara Brewer, lives w parents, and patient has good friend.   Client Treatment Preferences OPT  Client Statement of Needs Tamara Brewer would like to find peace and happiness, be content at home, enjoy life, get house in order to be sold and mentally keep mind set to go forward.    Treatment Level Weekly/Biweekly  Symptoms  Worrying about things, feeling nervous, trouble relaxing, becoming easily annoyed or irritable, feelng down, having little energy, trouble concentrating.   Goals:   Tamara Brewer experiences symptoms of anxiety   Target Date: 01/01/25 Frequency: Weekly/Biweekly  Progress: 0 Modality: individual    Therapist will provide referrals for additional resources as appropriate.  Therapist will provide psycho-education regarding Tamara Brewer's diagnosis and corresponding treatment approaches and interventions. Tamara Brewer will support the patient's ability to achieve the goals identified. will employ CBT, BA, Problem-solving, Solution Focused, Mindfulness,  coping skills, & other evidenced-based practices will be used to promote progress  towards healthy functioning to help manage decrease symptoms associated with their diagnosis.   Reduce overall level, frequency, and intensity of the feelings of depression, anxiety and panic evidenced by decreased  overall symptoms from 6 to 7 days/week to 0 to 1 days/week per client report for at least 3 consecutive months. Verbally express understanding of the relationship between feelings of anxiety and their impact on thinking patterns and behaviors. Verbalize an understanding of the role that distorted thinking plays in creating fears, excessive worry, and ruminations.  Tamara Brewer participated in the creation of the treatment plan)    Tamara Brewer

## 2024-03-25 ENCOUNTER — Ambulatory Visit (INDEPENDENT_AMBULATORY_CARE_PROVIDER_SITE_OTHER): Admitting: Psychology

## 2024-03-25 DIAGNOSIS — F419 Anxiety disorder, unspecified: Secondary | ICD-10-CM | POA: Diagnosis not present

## 2024-03-25 NOTE — Progress Notes (Signed)
  Behavioral Health Counselor/Therapist Progress Note  Patient ID: Tamara Brewer, MRN: [id number]    Date: 03/25/24  Time Spent: 6:00 pm - 6:54 pm : 54 minutes   Treatment Type: Individual Therapy.  Reported Symptoms:   Worrying about things, feeling nervous, trouble relaxing, becoming easily annoyed or irritable, feelng down, having little energy, trouble concentrating.   Mental Status Exam: Appearance:  Well Groomed     Behavior: Appropriate  Motor: Normal  Speech/Language:  Normal Rate  Affect: Appropriate  Mood: normal  Thought process: normal  Thought content:   WNL  Sensory/Perceptual disturbances:   WNL  Orientation: oriented to person, place, and time/date  Attention: Good  Concentration: Good  Memory: WNL  Fund of knowledge:  Good  Insight:   Good  Judgment:  Good  Impulse Control: Good   Risk Assessment: Danger to Self:  No Self-injurious Behavior: No Danger to Others: No Duty to Warn:no Physical Aggression / Violence:No  Access to Firearms a concern: No  Gang Involvement:No   Subjective:   BRISSIA DELISA participated from home, via video, and consented to treatment. I discussed the limitations of evaluation and management by telemedicine and the availability of in person appointments. The patient expressed understanding and agreed to proceed.  Therapist participated from home office.  Volanda reviewed the events of the past week.   Patient is trying to get the house ready to sell as the divorce is going forward. Patient needs to get some more information to her atty, and her husband has a meeting with his atty on May 28. Patient feels stressed about everything she has to do, while at the same time feeling relieved that she is moving forward.   Interventions: Cognitive Behavioral Therapy  Diagnosis:  Anxiety disorder, unspecified type [F41.9]    Psychiatric Treatment: Yes , in the past, talked to counselor before Covid.   Treatment Plan:  Client  Abilities  Patient is intelligent, motivated for change, self aware, committed to creating a happy and peaceful life for herself and her adult children.   Support System: [name], in [place], sister Garfield, in [place], bro. [name], lives w parents, and patient has good friend.   Client Treatment Preferences OPT  Client Statement of Needs Haden would like to find peace and happiness, be content at home, enjoy life, get house in order to be sold and mentally keep mind set to go forward.    Treatment Level Biweekly  Symptoms Worrying about things, feeling nervous, trouble relaxing, becoming easily annoyed or irritable, feelng down, having little energy, trouble concentrating  (Status: maintained)  Goals:   Takeira experiences symptoms of anxiety   Target Date: 01/01/25 Frequency: Biweekly  Progress: 0 Modality: individual    Therapist will provide referrals for additional resources as appropriate.  Therapist will provide psycho-education regarding Hadiyah's diagnosis and corresponding treatment approaches and interventions. [name] will support the patient's ability to achieve the goals identified. will employ CBT, BA, Problem-solving, Solution Focused, Mindfulness,  coping skills, & other evidenced-based practices will be used to promote progress towards healthy functioning to help manage decrease symptoms associated with their diagnosis.   Reduce overall level, frequency, and intensity of the feelings of depression, anxiety and panic evidenced by decreased overall symptoms from 6 to 7 days/week to 0 to 1 days/week per client report for at least 3 consecutive months. Verbally express understanding of the relationship between feelings of anxiety and their impact on thinking patterns and behaviors. Verbalize an understanding of the  role that distorted thinking plays in creating fears, excessive worry, and ruminations.  [name] participated in the creation of the treatment  plan)    [name]

## 2024-03-30 ENCOUNTER — Other Ambulatory Visit (HOSPITAL_COMMUNITY): Payer: Self-pay

## 2024-04-01 ENCOUNTER — Other Ambulatory Visit: Payer: Self-pay

## 2024-04-01 ENCOUNTER — Other Ambulatory Visit (HOSPITAL_COMMUNITY): Payer: Self-pay

## 2024-04-06 ENCOUNTER — Other Ambulatory Visit (HOSPITAL_COMMUNITY): Payer: Self-pay

## 2024-04-08 ENCOUNTER — Ambulatory Visit (INDEPENDENT_AMBULATORY_CARE_PROVIDER_SITE_OTHER): Admitting: Psychology

## 2024-04-08 DIAGNOSIS — F419 Anxiety disorder, unspecified: Secondary | ICD-10-CM

## 2024-04-08 NOTE — Progress Notes (Signed)
 Tamara Brewer, MRN: [id number]    Date: 04/08/24  Time Spent: 6:00 pm - 6:56  pm : 56 minutes  Treatment Type: Individual Therapy.  Reported Symptoms: Worrying about things, feeling nervous, trouble relaxing, becoming easily annoyed or irritable, feelng down, having little energy, trouble concentrating.    Mental Status Exam: Appearance:  Well Groomed     Behavior: Appropriate  Motor: Normal  Speech/Language:  Normal Rate  Affect: Appropriate  Mood: normal  Thought process: normal  Thought content:   WNL  Sensory/Perceptual disturbances:   WNL  Orientation: oriented to person, place, and time/date  Attention: Good  Concentration: Good  Memory: WNL  Fund of knowledge:  Good  Insight:   Good  Judgment:  Good  Impulse Control: Good   Risk Assessment: Danger to Self:  No Self-injurious Behavior: No Danger to Others: No Duty to Warn:no Physical Aggression / Violence:No  Access to Firearms a concern: No  Gang Involvement:No   Subjective:   Tamara Brewer participated from home, via video, and consented to treatment. I discussed the limitations of evaluation and management by telemedicine and the availability of in person appointments. The patient expressed understanding and agreed to proceed.  Therapist participated from home office. Tamara Brewer reviewed the events of the past week.   Patient reported that her husband made comments about what she needed to change. Patient's daughter had gotten the job offer that she wanted, and would be moving to [place] . Patient continued to prepare their house for sale, and she and her husband continued to move forward with separation.  Interventions: Cognitive Behavioral Therapy  Diagnosis: Anxiety disorder, unspecified type [F41.9]   Psychiatric Treatment: Yes , in the past, talked to counselor before Covid.  Treatment Plan:  Client Abilities/Strengths Tamara Brewer in the  past, talked to counselor before Covid.    Support System: Tamara Brewer, in [place], sister Tamara Brewer, in [place], bro. Tamara Brewer, lives w parents, and patient has good friend.   Client Treatment Preferences OPT  Client Statement of Needs Tamara Brewer would like to find peace and happiness, be content at home, enjoy life, get house in order to be sold and mentally keep mind set to go forward.      Treatment Level Biweekly  Symptoms  Worrying about things, feeling nervous, trouble relaxing, becoming easily annoyed or irritable, feelng down, having little energy, trouble concentrating  (Status: maintained)   Goals:   Tamara Brewer experiences symptoms of anxiety.   Target Date: 01/01/25 Frequency: Biweekly  Progress: 0 Modality: individual    Therapist will provide referrals for additional resources as appropriate.  Therapist will provide psycho-education regarding [name]'s diagnosis and corresponding treatment approaches and interventions. Tamara Brewer will support the patient's ability to achieve the goals identified. will employ CBT, BA, Problem-solving, Solution Focused, Mindfulness,  coping skills, & other evidenced-based practices will be used to promote progress towards healthy functioning to help manage decrease symptoms associated with their diagnosis.   Reduce overall level, frequency, and intensity of the feelings of depression, anxiety and panic evidenced by decreased overall symptoms  from 6 to 7 days/week to 0 to 1 days/week per client report for at least 3 consecutive months. Verbally express understanding of the relationship between feelings of anxiety and their impact on thinking patterns and behaviors. Verbalize an understanding of the role that distorted thinking plays in creating fears, excessive worry, and ruminations.  Tamara Brewer participated in the creation of the treatment plan)  Tamara Brewer

## 2024-04-13 ENCOUNTER — Other Ambulatory Visit: Payer: Self-pay

## 2024-04-22 ENCOUNTER — Ambulatory Visit: Admitting: Psychology

## 2024-04-22 DIAGNOSIS — F419 Anxiety disorder, unspecified: Secondary | ICD-10-CM | POA: Diagnosis not present

## 2024-04-22 NOTE — Progress Notes (Signed)
 [facility] Counselor/Therapist Progress Note  Patient ID: Tamara Brewer, MRN: [id number]    Date: 04/22/24  Time Spent: 6:00 pm - 6:44 pm  :44 minutes  Treatment Type: Individual Therapy.  Reported Symptoms:  Worrying about things, feeling nervous, trouble relaxing, becoming easily annoyed or irritable, feelng down, having little energy, trouble concentrating.   Mental Status Exam: Appearance:  Well Groomed     Behavior: Appropriate  Motor: Normal  Speech/Language:  Normal Rate  Affect: Appropriate  Mood: normal  Thought process: normal  Thought content:   WNL  Sensory/Perceptual disturbances:   WNL  Orientation: oriented to person, place, and time/date  Attention: Good  Concentration: Good  Memory: WNL  Fund of knowledge:  Good  Insight:   Good  Judgment:  Good  Impulse Control: Good   Risk Assessment: Danger to Self:  No Self-injurious Behavior: No Danger to Others: No Duty to Warn:no Physical Aggression / Violence:No  Access to Firearms a concern: No  Gang Involvement:No   Subjective:   Tamara Brewer participated from home, via video, and consented to treatment. I discussed the limitations of evaluation and management by telemedicine and the availability of in person appointments. The patient expressed understanding and agreed to proceed.  Therapist participated from home office.  Tamara Brewer reviewed the events of the past week.   Patient hopes to be moving into her new apartment on July 1, this coming w/e has a graduation weekend, weekend after that nothing, then mother's birthday celebration in [place] , then following that she'll move into apartment, then week after that patient will be leaving for a week in [place]  where daughter will be. Patient's father has had melanoma in several places in the past, and has some concerns regarding a recent lump that he found. Patient encouraged her father to go to the doctor to get checked. We discussed Tamara Brewer  goals treatment goals which include finding peace, being content, finding happiness, have an even keel, be content at home, enjoy life, be less stuck, get house in order to be sold, mentally keep mind set to go forward. Patient has been making progress on all of her goals. Patient plans to take extra good of herself by getting enough sleep and exercise.   Interventions: Cognitive Behavioral Therapy  Diagnosis:  Anxiety disorder, unspecified type [F41.9]   Psychiatric Treatment: Yes , talked to counselor before Covid.  Treatment Plan:  Client Abilities/Strengths Tamara Brewer is intelligent, motivated for change, self aware, committed to creating a happy and peaceful life for herself and her adult children.    Support System: Sister, Erby Hatcher, in [place], sister Tamara Brewer, in [place], bro. Tamara Brewer, lives w parents, and patient has good friends   Hospital doctor Preferences OPT  Theatre manager of Needs Tamara Brewer would like to find peace and happiness, be content at home, enjoy life, get house in order to be sold and mentally keep mind set to go forward.     Treatment Level Biweekly  Symptoms  Anxiety: worrying about things, feeling nervous, trouble relaxing, becoming easily annoyed or irritable,  Depression: feeling down, having little energy, trouble  Goals:   Tamara Brewer experiences symptoms of anxiety.   Target Date: 01/01/25 Frequency: Biweekly  Progress: 0 Modality: individual    Therapist will provide referrals for additional resources as appropriate.  Therapist will provide psycho-education regarding Tamara Brewer's diagnosis and corresponding treatment approaches and interventions. Tamara Brewer will support the patient's ability to achieve the goals identified. will employ CBT, BA, Problem-solving, Solution  Focused, Mindfulness,  coping skills, & other evidenced-based practices will be used to promote progress towards healthy functioning to help manage decrease symptoms associated with her  diagnosis.   Reduce overall level, frequency, and intensity of the feelings of depression, anxiety and panic evidenced by decreased overall symptoms from 6 to 7 days/week to 0 to 1 days/week per client report for at least 3 consecutive months. Verbally express understanding of the relationship between feelings of anxiety and it's impact on thinking patterns and behaviors. Verbalize an understanding of the role that distorted thinking plays in creating fears, excessive worry, and ruminations.  [name] participated in the creation of the treatment plan)    Tamara Brewer

## 2024-05-17 ENCOUNTER — Other Ambulatory Visit (HOSPITAL_COMMUNITY): Payer: Self-pay

## 2024-05-28 ENCOUNTER — Other Ambulatory Visit (HOSPITAL_COMMUNITY): Payer: Self-pay

## 2024-06-03 ENCOUNTER — Ambulatory Visit: Admitting: Psychology

## 2024-06-03 DIAGNOSIS — H5213 Myopia, bilateral: Secondary | ICD-10-CM | POA: Diagnosis not present

## 2024-06-17 ENCOUNTER — Ambulatory Visit: Admitting: Psychology

## 2024-06-24 ENCOUNTER — Other Ambulatory Visit (HOSPITAL_COMMUNITY): Payer: Self-pay

## 2024-06-30 ENCOUNTER — Other Ambulatory Visit (HOSPITAL_COMMUNITY): Payer: Self-pay

## 2024-07-01 ENCOUNTER — Ambulatory Visit: Admitting: Psychology

## 2024-07-01 ENCOUNTER — Other Ambulatory Visit: Payer: Self-pay

## 2024-07-05 ENCOUNTER — Other Ambulatory Visit: Payer: Self-pay

## 2024-07-05 ENCOUNTER — Other Ambulatory Visit (HOSPITAL_COMMUNITY): Payer: Self-pay

## 2024-07-05 ENCOUNTER — Other Ambulatory Visit: Payer: Self-pay | Admitting: Family Medicine

## 2024-07-05 MED ORDER — POTASSIUM CHLORIDE CRYS ER 20 MEQ PO TBCR
20.0000 meq | EXTENDED_RELEASE_TABLET | Freq: Every day | ORAL | 3 refills | Status: AC
  Filled 2024-07-05: qty 90, 90d supply, fill #0
  Filled 2024-10-03: qty 90, 90d supply, fill #1

## 2024-07-29 ENCOUNTER — Other Ambulatory Visit (HOSPITAL_COMMUNITY): Payer: Self-pay

## 2024-07-29 DIAGNOSIS — Z13820 Encounter for screening for osteoporosis: Secondary | ICD-10-CM | POA: Diagnosis not present

## 2024-07-29 DIAGNOSIS — M8580 Other specified disorders of bone density and structure, unspecified site: Secondary | ICD-10-CM

## 2024-07-29 HISTORY — DX: Other specified disorders of bone density and structure, unspecified site: M85.80

## 2024-08-20 ENCOUNTER — Encounter: Payer: Self-pay | Admitting: Family Medicine

## 2024-08-20 ENCOUNTER — Ambulatory Visit: Admitting: Family Medicine

## 2024-08-20 ENCOUNTER — Ambulatory Visit: Payer: Self-pay | Admitting: Family Medicine

## 2024-08-20 ENCOUNTER — Other Ambulatory Visit: Payer: Self-pay

## 2024-08-20 ENCOUNTER — Other Ambulatory Visit (HOSPITAL_BASED_OUTPATIENT_CLINIC_OR_DEPARTMENT_OTHER): Payer: Self-pay

## 2024-08-20 VITALS — BP 110/68 | HR 55 | Temp 97.7°F | Ht 63.25 in | Wt 123.8 lb

## 2024-08-20 DIAGNOSIS — Z209 Contact with and (suspected) exposure to unspecified communicable disease: Secondary | ICD-10-CM | POA: Diagnosis not present

## 2024-08-20 DIAGNOSIS — Z136 Encounter for screening for cardiovascular disorders: Secondary | ICD-10-CM | POA: Diagnosis not present

## 2024-08-20 DIAGNOSIS — Z23 Encounter for immunization: Secondary | ICD-10-CM

## 2024-08-20 DIAGNOSIS — Z0000 Encounter for general adult medical examination without abnormal findings: Secondary | ICD-10-CM | POA: Diagnosis not present

## 2024-08-20 DIAGNOSIS — E039 Hypothyroidism, unspecified: Secondary | ICD-10-CM | POA: Diagnosis not present

## 2024-08-20 LAB — CBC WITH DIFFERENTIAL/PLATELET
Basophils Absolute: 0.1 K/uL (ref 0.0–0.1)
Basophils Relative: 1.3 % (ref 0.0–3.0)
Eosinophils Absolute: 0.1 K/uL (ref 0.0–0.7)
Eosinophils Relative: 1.9 % (ref 0.0–5.0)
HCT: 42.9 % (ref 36.0–46.0)
Hemoglobin: 14.4 g/dL (ref 12.0–15.0)
Lymphocytes Relative: 24.6 % (ref 12.0–46.0)
Lymphs Abs: 1.2 K/uL (ref 0.7–4.0)
MCHC: 33.6 g/dL (ref 30.0–36.0)
MCV: 94.6 fl (ref 78.0–100.0)
Monocytes Absolute: 0.4 K/uL (ref 0.1–1.0)
Monocytes Relative: 7.4 % (ref 3.0–12.0)
Neutro Abs: 3.2 K/uL (ref 1.4–7.7)
Neutrophils Relative %: 64.8 % (ref 43.0–77.0)
Platelets: 281 K/uL (ref 150.0–400.0)
RBC: 4.54 Mil/uL (ref 3.87–5.11)
RDW: 13.2 % (ref 11.5–15.5)
WBC: 5 K/uL (ref 4.0–10.5)

## 2024-08-20 LAB — HEPATIC FUNCTION PANEL
ALT: 29 U/L (ref 0–35)
AST: 34 U/L (ref 0–37)
Albumin: 5 g/dL (ref 3.5–5.2)
Alkaline Phosphatase: 59 U/L (ref 39–117)
Bilirubin, Direct: 0.1 mg/dL (ref 0.0–0.3)
Total Bilirubin: 0.7 mg/dL (ref 0.2–1.2)
Total Protein: 7.6 g/dL (ref 6.0–8.3)

## 2024-08-20 LAB — BASIC METABOLIC PANEL WITH GFR
BUN: 14 mg/dL (ref 6–23)
CO2: 25 meq/L (ref 19–32)
Calcium: 9.7 mg/dL (ref 8.4–10.5)
Chloride: 100 meq/L (ref 96–112)
Creatinine, Ser: 0.74 mg/dL (ref 0.40–1.20)
GFR: 89.71 mL/min (ref >60.00)
Glucose, Bld: 87 mg/dL (ref 70–99)
Potassium: 4.6 meq/L (ref 3.5–5.1)
Sodium: 137 meq/L (ref 135–145)

## 2024-08-20 LAB — LIPID PANEL
Cholesterol: 250 mg/dL — ABNORMAL HIGH (ref 0–200)
HDL: 107.3 mg/dL (ref >39.00)
LDL Cholesterol: 123 mg/dL — ABNORMAL HIGH (ref 0–99)
NonHDL: 142.97
Total CHOL/HDL Ratio: 2
Triglycerides: 99 mg/dL (ref 0.0–149.0)
VLDL: 19.8 mg/dL (ref 0.0–40.0)

## 2024-08-20 LAB — T3, FREE: T3, Free: 3.4 pg/mL (ref 2.3–4.2)

## 2024-08-20 LAB — HEMOGLOBIN A1C: Hgb A1c MFr Bld: 5.8 % (ref 4.6–6.5)

## 2024-08-20 LAB — VITAMIN D 25 HYDROXY (VIT D DEFICIENCY, FRACTURES): VITD: 37.89 ng/mL (ref 30.00–100.00)

## 2024-08-20 LAB — T4, FREE: Free T4: 0.99 ng/dL (ref 0.60–1.60)

## 2024-08-20 LAB — TSH: TSH: 0.48 u[IU]/mL (ref 0.35–5.50)

## 2024-08-20 MED ORDER — LEVOTHYROXINE SODIUM 100 MCG PO TABS
100.0000 ug | ORAL_TABLET | Freq: Every day | ORAL | 3 refills | Status: AC
Start: 2024-08-20 — End: ?
  Filled 2024-08-20 – 2024-09-19 (×2): qty 90, 90d supply, fill #0

## 2024-08-20 MED ORDER — ESCITALOPRAM OXALATE 10 MG PO TABS
10.0000 mg | ORAL_TABLET | Freq: Every day | ORAL | 3 refills | Status: AC
  Filled 2024-08-20 – 2024-09-19 (×2): qty 90, 90d supply, fill #0

## 2024-08-20 NOTE — Progress Notes (Signed)
   Subjective:    Patient ID: Tamara Brewer, female    DOB: [birth year], 58 y.o.   MRN: [id number]  HPI Here for a well exam. She feels fine. She had a DEXA on 07-29-24 showing osteopenia. She still exercises and eats a healthy diet. She has recently separated from her husband. She asks for some STD testing for her peace of mind.    Review of Systems  Constitutional: Negative.   HENT: Negative.    Eyes: Negative.   Respiratory: Negative.    Cardiovascular: Negative.   Gastrointestinal: Negative.   Genitourinary:  Negative for decreased urine volume, difficulty urinating, dyspareunia, dysuria, enuresis, flank pain, frequency, hematuria, pelvic pain and urgency.  Musculoskeletal: Negative.   Skin: Negative.   Neurological: Negative.  Negative for headaches.  Psychiatric/Behavioral: Negative.         Objective:   Physical Exam Constitutional:      General: She is not in acute distress.    Appearance: Normal appearance. She is well-developed.  HENT:     Head: Normocephalic and atraumatic.     Right Ear: External ear normal.     Left Ear: External ear normal.     Nose: Nose normal.     Mouth/Throat:     Pharynx: No oropharyngeal exudate.  Eyes:     General: No scleral icterus.    Conjunctiva/sclera: Conjunctivae normal.     Pupils: Pupils are equal, round, and reactive to light.  Neck:     Thyroid : No thyromegaly.     Vascular: No JVD.  Cardiovascular:     Rate and Rhythm: Normal rate and regular rhythm.     Pulses: Normal pulses.     Heart sounds: Normal heart sounds. No murmur heard.    No friction rub. No gallop.  Pulmonary:     Effort: Pulmonary effort is normal. No respiratory distress.     Breath sounds: Normal breath sounds. No wheezing or rales.  Chest:     Chest wall: No tenderness.  Abdominal:     General: Bowel sounds are normal. There is no distension.     Palpations: Abdomen is soft. There is no mass.     Tenderness: There is no abdominal tenderness. There  is no guarding or rebound.  Musculoskeletal:        General: No tenderness. Normal range of motion.     Cervical back: Normal range of motion and neck supple.  Lymphadenopathy:     Cervical: No cervical adenopathy.  Skin:    General: Skin is warm and dry.     Findings: No erythema or rash.  Neurological:     Mental Status: She is alert and oriented to person, place, and time.     Cranial Nerves: No cranial nerve deficit.     Motor: No abnormal muscle tone.     Coordination: Coordination normal.     Deep Tendon Reflexes: Reflexes are normal and symmetric. Reflexes normal.  Psychiatric:        Mood and Affect: Mood normal.        Behavior: Behavior normal.        Thought Content: Thought content normal.           Assessment & Plan:  Well exam. We discussed diet and exercise. Get fasting labs. Get some STD screens. We will check a vitamin D  level.  Garnette Olmsted, MD

## 2024-08-20 NOTE — Addendum Note (Signed)
 Addended by: LADONNA INOCENTE SAILOR on: 08/20/2024 09:15 AM   Modules accepted: Orders

## 2024-08-21 ENCOUNTER — Other Ambulatory Visit (HOSPITAL_COMMUNITY): Payer: Self-pay

## 2024-08-21 LAB — RPR: RPR Ser Ql: NONREACTIVE

## 2024-08-21 LAB — HIV ANTIBODY (ROUTINE TESTING W REFLEX)
HIV 1&2 Ab, 4th Generation: NONREACTIVE
HIV FINAL INTERPRETATION: NEGATIVE

## 2024-08-21 LAB — C. TRACHOMATIS/N. GONORRHOEAE RNA
C. trachomatis RNA, TMA: NOT DETECTED
N. gonorrhoeae RNA, TMA: NOT DETECTED

## 2024-08-23 ENCOUNTER — Other Ambulatory Visit: Payer: Self-pay

## 2024-08-24 ENCOUNTER — Other Ambulatory Visit (HOSPITAL_COMMUNITY): Payer: Self-pay

## 2024-08-26 ENCOUNTER — Ambulatory Visit (HOSPITAL_BASED_OUTPATIENT_CLINIC_OR_DEPARTMENT_OTHER)
Admission: RE | Admit: 2024-08-26 | Discharge: 2024-08-26 | Disposition: A | Discharge status: Home / Self Care | Payer: Self-pay | Source: Ambulatory Visit | Attending: Family Medicine | Admitting: Family Medicine

## 2024-08-26 DIAGNOSIS — Z136 Encounter for screening for cardiovascular disorders: Secondary | ICD-10-CM | POA: Insufficient documentation

## 2024-09-08 ENCOUNTER — Other Ambulatory Visit: Payer: Self-pay | Admitting: Medical Genetics

## 2024-09-08 DIAGNOSIS — Z006 Encounter for examination for normal comparison and control in clinical research program: Secondary | ICD-10-CM

## 2024-09-19 ENCOUNTER — Other Ambulatory Visit (HOSPITAL_COMMUNITY): Payer: Self-pay

## 2024-09-20 ENCOUNTER — Other Ambulatory Visit: Payer: Self-pay

## 2024-10-01 ENCOUNTER — Other Ambulatory Visit (HOSPITAL_COMMUNITY): Payer: Self-pay

## 2024-10-01 MED ORDER — ESTRADIOL 0.01 % VA CREA
TOPICAL_CREAM | VAGINAL | 0 refills | Status: AC
  Filled 2024-10-01: qty 42.5, 90d supply, fill #0

## 2024-10-04 ENCOUNTER — Other Ambulatory Visit (HOSPITAL_COMMUNITY): Payer: Self-pay

## 2024-10-11 LAB — GENECONNECT MOLECULAR SCREEN: Genetic Analysis Overall Interpretation: NEGATIVE

## 2024-12-31 ENCOUNTER — Institutional Professional Consult (permissible substitution)

## 2025-02-04 ENCOUNTER — Institutional Professional Consult (permissible substitution)
